# Patient Record
Sex: Male | Born: 1965 | Race: White | Hispanic: No | Marital: Married
Health system: Midwestern US, Community
[De-identification: ages and names within clinical notes are randomized; demographics above are authoritative.]

## PROBLEM LIST (undated history)

## (undated) DIAGNOSIS — K529 Noninfective gastroenteritis and colitis, unspecified: Secondary | ICD-10-CM

## (undated) DIAGNOSIS — C801 Malignant (primary) neoplasm, unspecified: Secondary | ICD-10-CM

## (undated) HISTORY — PX: COLON SURGERY: SHX602

## (undated) HISTORY — PX: FRACTURE SURGERY: SHX138

## (undated) HISTORY — DX: Noninfective gastroenteritis and colitis, unspecified: K52.9

---

## 2017-08-06 DIAGNOSIS — C189 Malignant neoplasm of colon, unspecified: Secondary | ICD-10-CM | POA: Insufficient documentation

## 2017-08-06 HISTORY — DX: Malignant neoplasm of colon, unspecified: C18.9

## 2017-12-02 NOTE — Nursing Note (Signed)
Medication Administration Follow Up-Text       Medication Administration Follow Up Entered On:  12/02/2017 21:57 EDT    Performed On:  12/02/2017 21:56 EDT by Nedra Hai, RN, Desiree      Intervention Information:     morphine  Performed by Nedra Hai, RN, Desiree on 12/02/2017 20:33:00 EDT       morphine,4mg   IM,Deltoid, Right       Med Response   ED Medication Response :   No adverse reaction, Symptoms improved   Numeric Rating Pain Scale :   7   Pasero Opioid Induced Sedation Scale :   1 = Awake and alert   Elaina Hoops - 12/02/2017 21:56 EDT

## 2017-12-02 NOTE — Nursing Note (Signed)
Medication Administration Follow Up-Text       Medication Administration Follow Up Entered On:  12/02/2017 21:57 EDT    Performed On:  12/02/2017 21:57 EDT by Nedra Hai, RN, Desiree      Intervention Information:     ketorolac  Performed by Nedra Hai RN, Desiree on 12/02/2017 20:33:00 EDT       ketorolac,60mg   IM,Thigh, Right       Med Response   ED Medication Response :   No adverse reaction, Symptoms improved   Numeric Rating Pain Scale :   7   Pasero Opioid Induced Sedation Scale :   1 = Awake and alert   Elaina Hoops - 12/02/2017 21:57 EDT

## 2017-12-12 DIAGNOSIS — C4491 Basal cell carcinoma of skin, unspecified: Secondary | ICD-10-CM

## 2017-12-12 HISTORY — DX: Basal cell carcinoma of skin, unspecified: C44.91

## 2018-03-10 DIAGNOSIS — N529 Male erectile dysfunction, unspecified: Secondary | ICD-10-CM | POA: Insufficient documentation

## 2018-04-10 ENCOUNTER — Inpatient Hospital Stay
Admit: 2018-04-10 | Discharge: 2018-04-10 | Disposition: A | Discharge status: Home / Self Care | Payer: TRICARE (CHAMPUS) | Attending: Emergency Medicine

## 2018-04-10 DIAGNOSIS — K529 Noninfective gastroenteritis and colitis, unspecified: Secondary | ICD-10-CM

## 2018-04-10 LAB — CBC WITH AUTO DIFFERENTIAL
Basophils %: 1 % (ref 0.0–2.0)
Basophils Absolute: 0 10*3/uL (ref 0.0–0.2)
Eosinophils %: 5 % (ref 0.5–7.8)
Eosinophils Absolute: 0.4 10*3/uL (ref 0.0–0.8)
Granulocyte Absolute Count: 0 10*3/uL (ref 0.0–0.5)
Hematocrit: 44.6 % (ref 41.1–50.3)
Hemoglobin: 14.7 g/dL (ref 13.6–17.2)
Immature Granulocytes: 0 % (ref 0.0–5.0)
Lymphocytes %: 18 % (ref 13–44)
Lymphocytes Absolute: 1.5 10*3/uL (ref 0.5–4.6)
MCH: 29.1 PG (ref 26.1–32.9)
MCHC: 33 g/dL (ref 31.4–35.0)
MCV: 88.3 FL (ref 79.6–97.8)
MPV: 9.6 FL (ref 9.4–12.3)
Monocytes %: 6 % (ref 4.0–12.0)
Monocytes Absolute: 0.5 10*3/uL (ref 0.1–1.3)
NRBC Absolute: 0 10*3/uL (ref 0.0–0.2)
Neutrophils %: 71 % (ref 43–78)
Neutrophils Absolute: 6 10*3/uL (ref 1.7–8.2)
Platelets: 237 10*3/uL (ref 150–450)
RBC: 5.05 M/uL (ref 4.23–5.6)
RDW: 12.6 % (ref 11.9–14.6)
WBC: 8.5 10*3/uL (ref 4.3–11.1)

## 2018-04-10 LAB — COMPREHENSIVE METABOLIC PANEL
ALT: 38 U/L (ref 12–65)
AST: 15 U/L (ref 15–37)
Albumin/Globulin Ratio: 1.1 — ABNORMAL LOW (ref 1.2–3.5)
Albumin: 4.2 g/dL (ref 3.5–5.0)
Alkaline Phosphatase: 91 U/L (ref 50–136)
Anion Gap: 6 mmol/L — ABNORMAL LOW (ref 7–16)
BUN: 14 MG/DL (ref 6–23)
CO2: 29 mmol/L (ref 21–32)
Calcium: 9 MG/DL (ref 8.3–10.4)
Chloride: 101 mmol/L (ref 98–107)
Creatinine: 0.9 MG/DL (ref 0.8–1.5)
EGFR IF NonAfrican American: >60 mL/min/{1.73_m2} (ref >60)
GFR African American: >60 mL/min/{1.73_m2} (ref >60)
Globulin: 3.7 g/dL — ABNORMAL HIGH (ref 2.3–3.5)
Glucose: 101 mg/dL — ABNORMAL HIGH (ref 65–100)
Potassium: 4.3 mmol/L (ref 3.5–5.1)
Sodium: 136 mmol/L (ref 136–145)
Total Bilirubin: 1 MG/DL (ref 0.2–1.1)
Total Protein: 7.9 g/dL (ref 6.3–8.2)

## 2018-04-10 LAB — EKG 12-LEAD
Atrial Rate: 300 {beats}/min
Q-T Interval: 426 ms
QRS Duration: 78 ms
QTc Calculation (Bazett): 426 ms
R Axis: 72 degrees
T Axis: 48 degrees
Ventricular Rate: 60 {beats}/min

## 2018-04-10 LAB — LIPASE
Lipase: 188 U/L (ref 73–393)
Lipase: 188 U/L (ref 73–393)

## 2018-04-10 LAB — CBC WITH AUTOMATED DIFF
ABS. BASOPHILS: 0 10*3/uL (ref 0.0–0.2)
ABS. EOSINOPHILS: 0.4 10*3/uL (ref 0.0–0.8)
ABS. IMM. GRANS.: 0 10*3/uL (ref 0.0–0.5)
ABS. LYMPHOCYTES: 1.5 10*3/uL (ref 0.5–4.6)
ABS. MONOCYTES: 0.5 10*3/uL (ref 0.1–1.3)
ABS. NEUTROPHILS: 6 10*3/uL (ref 1.7–8.2)
ABSOLUTE NRBC: 0 10*3/uL (ref 0.0–0.2)
BASOPHILS: 1 % (ref 0.0–2.0)
EOSINOPHILS: 5 % (ref 0.5–7.8)
HCT: 44.6 % (ref 41.1–50.3)
HGB: 14.7 g/dL (ref 13.6–17.2)
IMMATURE GRANULOCYTES: 0 % (ref 0.0–5.0)
LYMPHOCYTES: 18 % (ref 13–44)
MCH: 29.1 PG (ref 26.1–32.9)
MCHC: 33 g/dL (ref 31.4–35.0)
MCV: 88.3 FL (ref 79.6–97.8)
MONOCYTES: 6 % (ref 4.0–12.0)
MPV: 9.6 FL (ref 9.4–12.3)
NEUTROPHILS: 71 % (ref 43–78)
PLATELET: 237 10*3/uL (ref 150–450)
RBC: 5.05 M/uL (ref 4.23–5.6)
RDW: 12.6 % (ref 11.9–14.6)
WBC: 8.5 10*3/uL (ref 4.3–11.1)

## 2018-04-10 LAB — METABOLIC PANEL, COMPREHENSIVE
A-G Ratio: 1.1 — ABNORMAL LOW (ref 1.2–3.5)
ALT (SGPT): 38 U/L (ref 12–65)
AST (SGOT): 15 U/L (ref 15–37)
Albumin: 4.2 g/dL (ref 3.5–5.0)
Alk. phosphatase: 91 U/L (ref 50–136)
Anion gap: 6 mmol/L — ABNORMAL LOW (ref 7–16)
BUN: 14 MG/DL (ref 6–23)
Bilirubin, total: 1 MG/DL (ref 0.2–1.1)
CO2: 29 mmol/L (ref 21–32)
Calcium: 9 MG/DL (ref 8.3–10.4)
Chloride: 101 mmol/L (ref 98–107)
Creatinine: 0.9 MG/DL (ref 0.8–1.5)
GFR est AA: >60 mL/min/{1.73_m2} (ref >60)
GFR est non-AA: >60 mL/min/{1.73_m2} (ref >60)
Globulin: 3.7 g/dL — ABNORMAL HIGH (ref 2.3–3.5)
Glucose: 101 mg/dL — ABNORMAL HIGH (ref 65–100)
Potassium: 4.3 mmol/L (ref 3.5–5.1)
Protein, total: 7.9 g/dL (ref 6.3–8.2)
Sodium: 136 mmol/L (ref 136–145)

## 2018-04-10 LAB — EKG, 12 LEAD, INITIAL
Atrial Rate: 300 {beats}/min
Calculated R Axis: 72 degrees
Calculated T Axis: 48 degrees
Q-T Interval: 426 ms
QRS Duration: 78 ms
QTC Calculation (Bezet): 426 ms
Ventricular Rate: 60 {beats}/min

## 2018-04-10 MED ORDER — SODIUM CHLORIDE 0.9% BOLUS IV
0.9 % | Freq: Once | INTRAVENOUS | Status: AC
Start: 2018-04-10 — End: 2018-04-10
  Administered 2018-04-10: 15:00:00 via INTRAVENOUS

## 2018-04-10 MED ORDER — ALUM-MAG HYDROXIDE-SIMETH 200 MG-200 MG-20 MG/5 ML ORAL SUSP
200-200-20 mg/5 mL | ORAL | Status: AC
Start: 2018-04-10 — End: 2018-04-10
  Administered 2018-04-10: 14:00:00 via ORAL

## 2018-04-10 MED ORDER — PANTOPRAZOLE 40 MG TAB, DELAYED RELEASE
40 mg | ORAL_TABLET | Freq: Every day | ORAL | 0 refills | Status: AC
Start: 2018-04-10 — End: 2018-04-30

## 2018-04-10 MED ORDER — ONDANSETRON (PF) 4 MG/2 ML INJECTION
4 mg/2 mL | INTRAMUSCULAR | Status: AC
Start: 2018-04-10 — End: 2018-04-10
  Administered 2018-04-10: 15:00:00 via INTRAVENOUS

## 2018-04-10 MED ORDER — LIDOCAINE 2 % MUCOSAL SOLN
2 % | Status: AC
Start: 2018-04-10 — End: 2018-04-10
  Administered 2018-04-10: 14:00:00 via OROMUCOSAL

## 2018-04-10 MED ORDER — ONDANSETRON 4 MG TAB, RAPID DISSOLVE
4 mg | ORAL_TABLET | Freq: Three times a day (TID) | ORAL | 0 refills | Status: AC | PRN
Start: 2018-04-10 — End: ?

## 2018-04-10 MED FILL — MAG-AL PLUS 200 MG-200 MG-20 MG/5 ML ORAL SUSPENSION: 200-200-20 mg/5 mL | ORAL | Qty: 30

## 2018-04-10 MED FILL — LIDOCAINE 2 % MUCOSAL SOLN: 2 % | Qty: 15

## 2018-04-10 MED FILL — ONDANSETRON (PF) 4 MG/2 ML INJECTION: 4 mg/2 mL | INTRAMUSCULAR | Qty: 2

## 2018-04-10 NOTE — ED Notes (Signed)
Patient states he started having diffuse abdominal cramping since last night and states he passed a small blood clot when he felt like he had to have a bowel movement this morning. Patient states he has had some constipation for the past 1.5 months as well. Patient denies any vomiting but states he did have some nausea last night. Patient complains of some urinary retention last night that has since resolved.

## 2018-04-10 NOTE — ED Provider Notes (Signed)
Patient presents with epigastric and diffuse abdominal cramping starting last night at 2300.  Had a normal bowel movement at 2 AM but continues to have pain along with nausea but no vomiting so came in.    The history is provided by the patient. No language interpreter was used.   Abdominal Pain    This is a new problem. The current episode started yesterday. The problem occurs constantly. The problem has been gradually worsening. The pain is associated with an unknown factor. The pain is located in the generalized abdominal region. The quality of the pain is cramping. The pain is moderate. Associated symptoms include nausea. Pertinent negatives include no fever, no diarrhea, no melena, no vomiting, no constipation, no dysuria, no hematuria, no headaches, no chest pain and no back pain. Nothing worsens the pain. The pain is relieved by nothing.        Past Medical History:   Diagnosis Date   ??? Hypercholesteremia        Past Surgical History:   Procedure Laterality Date   ??? HX ORTHOPAEDIC     ??? HX UROLOGICAL           No family history on file.    Social History     Socioeconomic History   ??? Marital status: Not on file     Spouse name: Not on file   ??? Number of children: Not on file   ??? Years of education: Not on file   ??? Highest education level: Not on file   Occupational History   ??? Not on file   Social Needs   ??? Financial resource strain: Not on file   ??? Food insecurity:     Worry: Not on file     Inability: Not on file   ??? Transportation needs:     Medical: Not on file     Non-medical: Not on file   Tobacco Use   ??? Smoking status: Never Smoker   ??? Smokeless tobacco: Never Used   Substance and Sexual Activity   ??? Alcohol use: Yes     Comment: socially   ??? Drug use: Not Currently   ??? Sexual activity: Not on file   Lifestyle   ??? Physical activity:     Days per week: Not on file     Minutes per session: Not on file   ??? Stress: Not on file   Relationships   ??? Social connections:     Talks on phone: Not on file      Gets together: Not on file     Attends religious service: Not on file     Active member of club or organization: Not on file     Attends meetings of clubs or organizations: Not on file     Relationship status: Not on file   ??? Intimate partner violence:     Fear of current or ex partner: Not on file     Emotionally abused: Not on file     Physically abused: Not on file     Forced sexual activity: Not on file   Other Topics Concern   ??? Not on file   Social History Narrative   ??? Not on file         ALLERGIES: Patient has no known allergies.    Review of Systems   Constitutional: Negative for chills and fever.   HENT: Negative for rhinorrhea and sore throat.    Eyes: Negative for pain and redness.  Respiratory: Negative for chest tightness, shortness of breath and wheezing.    Cardiovascular: Negative for chest pain and leg swelling.   Gastrointestinal: Positive for abdominal pain and nausea. Negative for constipation, diarrhea, melena and vomiting.   Genitourinary: Negative for dysuria and hematuria.   Musculoskeletal: Negative for back pain, gait problem, neck pain and neck stiffness.   Skin: Negative for color change and rash.   Neurological: Negative for weakness, numbness and headaches.       Vitals:    04/10/18 1014   BP: (!) 128/92   Pulse: 68   Resp: 16   Temp: 98.7 ??F (37.1 ??C)   SpO2: 97%   Weight: 86.2 kg (190 lb)   Height: 6\' 2"  (1.88 m)            Physical Exam   Constitutional: He is oriented to person, place, and time. He appears well-developed and well-nourished.   HENT:   Head: Normocephalic and atraumatic.   Neck: Normal range of motion. Neck supple.   Cardiovascular: Normal rate and regular rhythm.   Pulmonary/Chest: Effort normal and breath sounds normal.   Abdominal: Soft. Bowel sounds are normal. There is tenderness (epigastric).   Musculoskeletal: Normal range of motion. He exhibits no edema.   Neurological: He is alert and oriented to person, place, and time.   Skin: Skin is warm and dry.         MDM  Number of Diagnoses or Management Options  Diagnosis management comments: And feeling better here after medication.  Will treat as gastroenteritis and discharged with follow-up.       Amount and/or Complexity of Data Reviewed  Clinical lab tests: ordered and reviewed  Tests in the medicine section of CPT??: ordered and reviewed    Patient Progress  Patient progress: stable         Procedures        EKG: normal sinus rhythm, nonspecific ST and T waves changes. RAte 60.        Results Include:    Recent Results (from the past 24 hour(s))   CBC WITH AUTOMATED DIFF    Collection Time: 04/10/18 10:16 AM   Result Value Ref Range    WBC 8.5 4.3 - 11.1 K/uL    RBC 5.05 4.23 - 5.6 M/uL    HGB 14.7 13.6 - 17.2 g/dL    HCT 14.7 82.9 - 56.2 %    MCV 88.3 79.6 - 97.8 FL    MCH 29.1 26.1 - 32.9 PG    MCHC 33.0 31.4 - 35.0 g/dL    RDW 13.0 86.5 - 78.4 %    PLATELET 237 150 - 450 K/uL    MPV 9.6 9.4 - 12.3 FL    ABSOLUTE NRBC 0.00 0.0 - 0.2 K/uL    DF AUTOMATED      NEUTROPHILS 71 43 - 78 %    LYMPHOCYTES 18 13 - 44 %    MONOCYTES 6 4.0 - 12.0 %    EOSINOPHILS 5 0.5 - 7.8 %    BASOPHILS 1 0.0 - 2.0 %    IMMATURE GRANULOCYTES 0 0.0 - 5.0 %    ABS. NEUTROPHILS 6.0 1.7 - 8.2 K/UL    ABS. LYMPHOCYTES 1.5 0.5 - 4.6 K/UL    ABS. MONOCYTES 0.5 0.1 - 1.3 K/UL    ABS. EOSINOPHILS 0.4 0.0 - 0.8 K/UL    ABS. BASOPHILS 0.0 0.0 - 0.2 K/UL    ABS. IMM. GRANS. 0.0 0.0 - 0.5 K/UL  METABOLIC PANEL, COMPREHENSIVE    Collection Time: 04/10/18 10:16 AM   Result Value Ref Range    Sodium 136 136 - 145 mmol/L    Potassium 4.3 3.5 - 5.1 mmol/L    Chloride 101 98 - 107 mmol/L    CO2 29 21 - 32 mmol/L    Anion gap 6 (L) 7 - 16 mmol/L    Glucose 101 (H) 65 - 100 mg/dL    BUN 14 6 - 23 MG/DL    Creatinine 9.52 0.8 - 1.5 MG/DL    GFR est AA >84 >13 KG/MWN/0.27O5    GFR est non-AA >60 >60 ml/min/1.42m2    Calcium 9.0 8.3 - 10.4 MG/DL    Bilirubin, total 1.0 0.2 - 1.1 MG/DL    ALT (SGPT) 38 12 - 65 U/L    AST (SGOT) 15 15 - 37 U/L    Alk. phosphatase  91 50 - 136 U/L    Protein, total 7.9 6.3 - 8.2 g/dL    Albumin 4.2 3.5 - 5.0 g/dL    Globulin 3.7 (H) 2.3 - 3.5 g/dL    A-G Ratio 1.1 (L) 1.2 - 3.5     LIPASE    Collection Time: 04/10/18 10:16 AM   Result Value Ref Range    Lipase 188 73 - 393 U/L   EKG, 12 LEAD, INITIAL    Collection Time: 04/10/18 11:00 AM   Result Value Ref Range    Ventricular Rate 60 BPM    Atrial Rate 300 BPM    QRS Duration 78 ms    Q-T Interval 426 ms    QTC Calculation (Bezet) 426 ms    Calculated R Axis 72 degrees    Calculated T Axis 48 degrees    Diagnosis       !! AGE AND GENDER SPECIFIC ECG ANALYSIS !!  Normal sinus rhythm  Within normal limits  No previous ECGs available  Confirmed by CEBE  MD (UC), Balinda Quails [phone]) on 04/10/2018 11:16:50 AM

## 2018-04-10 NOTE — ED Notes (Signed)
I have reviewed discharge instructions with the patient.  The patient verbalized understanding.    Patient left ED via Discharge Method: ambulatory to Home with his wife.    Opportunity for questions and clarification provided.       Patient given 3 scripts.         To continue your aftercare when you leave the hospital, you may receive an automated call from our care team to check in on how you are doing.  This is a free service and part of our promise to provide the best care and service to meet your aftercare needs." If you have questions, or wish to unsubscribe from this service please call [phone].  Thank you for Choosing our [facility] Emergency Department.

## 2018-04-10 NOTE — ED Triage Notes (Signed)
Patient states he started having diffuse abdominal cramping since last night and states he passed a small blood clot when he felt like he had to have a bowel movement this morning. Patient states he has had some constipation for the past 1.5 months as well. Patient denies any vomiting but states he did have some nausea last night. Patient complains of some urinary retention last night that has since resolved.

## 2018-04-10 NOTE — ED Notes (Signed)
I have reviewed discharge instructions with the patient.  The patient verbalized understanding.    Patient left ED via Discharge Method: ambulatory to Home with his wife.    Opportunity for questions and clarification provided.       Patient given 3 scripts.         To continue your aftercare when you leave the hospital, you may receive an automated call from our care team to check in on how you are doing.  This is a free service and part of our promise to provide the best care and service to meet your aftercare needs.??? If you have questions, or wish to unsubscribe from this service please call [phone].  Thank you for Choosing our [place] [facility] Department.

## 2018-04-10 NOTE — ED Provider Notes (Signed)
Patient presents with epigastric and diffuse abdominal cramping starting last night at 2300.  Had a normal bowel movement at 2 AM but continues to have pain along with nausea but no vomiting so came in.    The history is provided by the patient. No language interpreter was used.   Abdominal Pain    This is a new problem. The current episode started yesterday. The problem occurs constantly. The problem has been gradually worsening. The pain is associated with an unknown factor. The pain is located in the generalized abdominal region. The quality of the pain is cramping. The pain is moderate. Associated symptoms include nausea. Pertinent negatives include no fever, no diarrhea, no melena, no vomiting, no constipation, no dysuria, no hematuria, no headaches, no chest pain and no back pain. Nothing worsens the pain. The pain is relieved by nothing.        Past Medical History:   Diagnosis Date   ??? Hypercholesteremia        Past Surgical History:   Procedure Laterality Date   ??? HX ORTHOPAEDIC     ??? HX UROLOGICAL           No family history on file.    Social History     Socioeconomic History   ??? Marital status: Not on file     Spouse name: Not on file   ??? Number of children: Not on file   ??? Years of education: Not on file   ??? Highest education level: Not on file   Occupational History   ??? Not on file   Social Needs   ??? Financial resource strain: Not on file   ??? Food insecurity:     Worry: Not on file     Inability: Not on file   ??? Transportation needs:     Medical: Not on file     Non-medical: Not on file   Tobacco Use   ??? Smoking status: Never Smoker   ??? Smokeless tobacco: Never Used   Substance and Sexual Activity   ??? Alcohol use: Yes     Comment: socially   ??? Drug use: Not Currently   ??? Sexual activity: Not on file   Lifestyle   ??? Physical activity:     Days per week: Not on file     Minutes per session: Not on file   ??? Stress: Not on file   Relationships   ??? Social connections:     Talks on phone: Not on file      Gets together: Not on file     Attends religious service: Not on file     Active member of club or organization: Not on file     Attends meetings of clubs or organizations: Not on file     Relationship status: Not on file   ??? Intimate partner violence:     Fear of current or ex partner: Not on file     Emotionally abused: Not on file     Physically abused: Not on file     Forced sexual activity: Not on file   Other Topics Concern   ??? Not on file   Social History Narrative   ??? Not on file         ALLERGIES: Patient has no known allergies.    Review of Systems   Constitutional: Negative for chills and fever.   HENT: Negative for rhinorrhea and sore throat.    Eyes: Negative for pain and redness.  Respiratory: Negative for chest tightness, shortness of breath and wheezing.    Cardiovascular: Negative for chest pain and leg swelling.   Gastrointestinal: Positive for abdominal pain and nausea. Negative for constipation, diarrhea, melena and vomiting.   Genitourinary: Negative for dysuria and hematuria.   Musculoskeletal: Negative for back pain, gait problem, neck pain and neck stiffness.   Skin: Negative for color change and rash.   Neurological: Negative for weakness, numbness and headaches.       Vitals:    04/10/18 1014   BP: (!) 128/92   Pulse: 68   Resp: 16   Temp: 98.7 ??F (37.1 ??C)   SpO2: 97%   Weight: 86.2 kg (190 lb)   Height: '6\' 2"'  (1.88 m)            Physical Exam   Constitutional: He is oriented to person, place, and time. He appears well-developed and well-nourished.   HENT:   Head: Normocephalic and atraumatic.   Neck: Normal range of motion. Neck supple.   Cardiovascular: Normal rate and regular rhythm.   Pulmonary/Chest: Effort normal and breath sounds normal.   Abdominal: Soft. Bowel sounds are normal. There is tenderness (epigastric).   Musculoskeletal: Normal range of motion. He exhibits no edema.   Neurological: He is alert and oriented to person, place, and time.   Skin: Skin is warm and dry.         MDM  Number of Diagnoses or Management Options  Diagnosis management comments: And feeling better here after medication.  Will treat as gastroenteritis and discharged with follow-up.       Amount and/or Complexity of Data Reviewed  Clinical lab tests: ordered and reviewed  Tests in the medicine section of CPT??: ordered and reviewed    Patient Progress  Patient progress: stable         Procedures        EKG: normal sinus rhythm, nonspecific ST and T waves changes. RAte 60.        Results Include:    Recent Results (from the past 24 hour(s))   CBC WITH AUTOMATED DIFF    Collection Time: 04/10/18 10:16 AM   Result Value Ref Range    WBC 8.5 4.3 - 11.1 K/uL    RBC 5.05 4.23 - 5.6 M/uL    HGB 14.7 13.6 - 17.2 g/dL    HCT 44.6 41.1 - 50.3 %    MCV 88.3 79.6 - 97.8 FL    MCH 29.1 26.1 - 32.9 PG    MCHC 33.0 31.4 - 35.0 g/dL    RDW 12.6 11.9 - 14.6 %    PLATELET 237 150 - 450 K/uL    MPV 9.6 9.4 - 12.3 FL    ABSOLUTE NRBC 0.00 0.0 - 0.2 K/uL    DF AUTOMATED      NEUTROPHILS 71 43 - 78 %    LYMPHOCYTES 18 13 - 44 %    MONOCYTES 6 4.0 - 12.0 %    EOSINOPHILS 5 0.5 - 7.8 %    BASOPHILS 1 0.0 - 2.0 %    IMMATURE GRANULOCYTES 0 0.0 - 5.0 %    ABS. NEUTROPHILS 6.0 1.7 - 8.2 K/UL    ABS. LYMPHOCYTES 1.5 0.5 - 4.6 K/UL    ABS. MONOCYTES 0.5 0.1 - 1.3 K/UL    ABS. EOSINOPHILS 0.4 0.0 - 0.8 K/UL    ABS. BASOPHILS 0.0 0.0 - 0.2 K/UL    ABS. IMM. GRANS. 0.0 0.0 - 0.5 K/UL  METABOLIC PANEL, COMPREHENSIVE    Collection Time: 04/10/18 10:16 AM   Result Value Ref Range    Sodium 136 136 - 145 mmol/L    Potassium 4.3 3.5 - 5.1 mmol/L    Chloride 101 98 - 107 mmol/L    CO2 29 21 - 32 mmol/L    Anion gap 6 (L) 7 - 16 mmol/L    Glucose 101 (H) 65 - 100 mg/dL    BUN 14 6 - 23 MG/DL    Creatinine 0.90 0.8 - 1.5 MG/DL    GFR est AA >60 >60 ml/min/1.53m    GFR est non-AA >60 >60 ml/min/1.737m   Calcium 9.0 8.3 - 10.4 MG/DL    Bilirubin, total 1.0 0.2 - 1.1 MG/DL    ALT (SGPT) 38 12 - 65 U/L    AST (SGOT) 15 15 - 37 U/L     Alk. phosphatase 91 50 - 136 U/L    Protein, total 7.9 6.3 - 8.2 g/dL    Albumin 4.2 3.5 - 5.0 g/dL    Globulin 3.7 (H) 2.3 - 3.5 g/dL    A-G Ratio 1.1 (L) 1.2 - 3.5     LIPASE    Collection Time: 04/10/18 10:16 AM   Result Value Ref Range    Lipase 188 73 - 393 U/L   EKG, 12 LEAD, INITIAL    Collection Time: 04/10/18 11:00 AM   Result Value Ref Range    Ventricular Rate 60 BPM    Atrial Rate 300 BPM    QRS Duration 78 ms    Q-T Interval 426 ms    QTC Calculation (Bezet) 426 ms    Calculated R Axis 72 degrees    Calculated T Axis 48 degrees    Diagnosis       !! AGE AND GENDER SPECIFIC ECG ANALYSIS !!  Normal sinus rhythm  Within normal limits  No previous ECGs available  Confirmed by CEBE  MD (UC), JOBalinda [phone] 04/10/2018 11:16:50 AM

## 2018-05-30 NOTE — Nursing Note (Signed)
Ambulatory Vitals, Ht, Wt - Text       Ambulatory Vitals Height Weight Entered On:  05/30/2018 10:14 EDT    Performed On:  05/30/2018 10:13 EDT by Morene Rankins,  ROBIN M               Vitals/Ht/Wt   Weight Dosing :   87.09 kg(Converted to: 192 lb 0 oz, 192.001 lb)    Weight Measured :   87.09 kg(Converted to: 192 lb 0 oz, 192.001 lb)    Height/Length Measured :   188 cm(Converted to: 6 ft 2 in, 6.17 ft, 74.02 in)    BSA Measured :   2.14 m2   Body Mass Index Measured :   24.64 kg/m2   Lavone Neri - 05/30/2018 10:13 EDT

## 2018-06-06 DIAGNOSIS — Z860100 Personal history of colon polyps, unspecified: Secondary | ICD-10-CM

## 2018-06-06 HISTORY — PX: COLONOSCOPY: SHX174

## 2018-06-06 HISTORY — DX: Personal history of colon polyps, unspecified: Z86.0100

## 2018-06-06 HISTORY — DX: Personal history of colonic polyps: Z86.010

## 2018-06-11 DIAGNOSIS — C801 Malignant (primary) neoplasm, unspecified: Secondary | ICD-10-CM | POA: Insufficient documentation

## 2018-07-07 NOTE — Nursing Note (Signed)
Adult Admission Assessment - Text       Perioperative Admission Assessment Entered On:  07/07/2018 15:31 EST    Performed On:  07/07/2018 15:19 EST by Vista Deck, RN, [place]   Call Start :   07/07/2018 15:19 EST   Call Complete :   07/07/2018 15:31 EST   Information Given By :   Self   Height/Length Estimated :   188 cm(Converted to: 6 ft 2 in, 6.17 ft, 74.02 in)    Weight   Estimated :   86.1 kg(Converted to: 189 lb 13 oz, 189.818 lb)    Primary Care Physician/Specialists :   DR. [place]   Emergency Contact Name :   [place]   Emergency Contact Phone :   [phone]   Languages :   Cleophus Molt   Preferred Communication Mode :   Verbal   Vista Deck, RN, CARLA W - 07/07/2018 15:19 EST   Allergies   (As Of: 07/10/2018 11:47:13 EST)   Allergies (Active)   No Known Medication Allergies  Estimated Onset Date:   Unspecified ; Created ByTruman Hayward, RN, Desiree; Reaction Status:   Active ; Category:   Drug ; Substance:   No Known Medication Allergies ; Type:   Allergy ; Updated By:   Truman Hayward RN, [place]; Reviewed Date:   07/10/2018 11:43 EST        Medication History   Medication List   (As Of: 07/10/2018 11:47:14 EST)   Normal Order    Lactated Ringers Injection solution 1000 mL  :   Lactated Ringers Injection solution 1000 mL ; Status:   Ordered ; Ordered As Mnemonic:   Lactated Ringers Injection 1000 mL ; Simple Display Line:   40 mL/hr, IV ; Ordering Provider:   Kathryne Hitch; Catalog Code:   Lactated Ringers Injection ; Order Dt/Tm:   07/09/2018 23:08:35 EST ; Comment:   Perioperative use ONLY  For Non Dialysis Patient          sodium chloride 0.9% Inj Soln 10 mL syringe  :   sodium chloride 0.9% Inj Soln 10 mL syringe ; Status:   Ordered ; Ordered As Mnemonic:   sodium chloride 0.9% flush syringe range dose ; Simple Display Line:   30 mL, IV Push, q8hr ; Ordering Provider:   Elease Hashimoto; Catalog Code:   sodium chloride flush ; Order Dt/Tm:   07/10/2018 11:35:40 EST           A Patient Specific Medication  :   A Patient Specific Medication ; Status:   Ordered ; Ordered As Mnemonic:   A Patient Specific Medication ; Simple Display Line:   1 EA, Kit-Combo, q34mn, PRN: other (see comment) ; Ordering Provider:   DElease Hashimoto Catalog Code:   A Patient Specific Medication ; Order Dt/Tm:   07/10/2018 11:35:41 EST          A Patient Specific Refrigerated Medication  :   A Patient Specific Refrigerated Medication ; Status:   Ordered ; Ordered As Mnemonic:   A Patient Specific Refrigerated Medication ; Simple Display Line:   1 EA, Kit-Combo, q570m, PRN: other (see comment) ; Ordering Provider:   DEElease HashimotoCatalog Code:   A Patient Specific Refrigerated Medicati ; Order Dt/Tm:   07/10/2018 11:35:41 EST ; Comment:   to access the patient  specific Refrigerated medications          acetaminophen 500 mg Tab  :   acetaminophen 500 mg Tab ; Status:   Ordered ; Ordered As Mnemonic:   Tylenol ; Simple Display Line:   1,000 mg, 2 tabs, Oral, On Call ; Ordering Provider:   Kathryne Hitch; Catalog Code:   acetaminophen ; Order Dt/Tm:   07/09/2018 23:08:35 EST          ceFAZolin  :   ceFAZolin ; Status:   Ordered ; Ordered As Mnemonic:   ceFAZolin ; Simple Display Line:   2 g, IV Piggyback, On Call ; Ordering Provider:   Kathryne Hitch; Catalog Code:   ceFAZolin ; Order Dt/Tm:   07/09/2018 23:08:35 EST ; Comment:   Wt< 120 kg/264lb          celecoxib 400 mg Cap  :   celecoxib 400 mg Cap ; Status:   Ordered ; Ordered As Mnemonic:   CeleBREX ; Simple Display Line:   400 mg, 1 caps, Oral, On Call ; Ordering Provider:   Kathryne Hitch; Catalog Code:   celecoxib ; Order Dt/Tm:   07/09/2018 23:08:36 EST ; Comment:   Do not administer with Toradol          Delivery and Return Bin Access  :   Delivery and Return [place] Access ; Status:   Ordered ; Ordered As Mnemonic:   Delivery and Return Bin Access ; Simple Display Line:   1 EA, Kit-Combo, q45mn, PRN: other (see  comment) ; Ordering Provider:   DElease Hashimoto Catalog Code:   Delivery and Return Bin Access ; Order Dt/Tm:   07/10/2018 11:35:41 EST ; Comment:   This code grants access to the AConstellation Energyfor the Delivery and Return Bin Access          lidocaine 1% PF Inj Soln 2 mL  :   lidocaine 1% PF Inj Soln 2 mL ; Status:   Ordered ; Ordered As Mnemonic:   lidocaine 1% preservative-free injectable solution ; Simple Display Line:   0.25 mL, ID, q570m, PRN: other (see comment) ; Ordering Provider:   DEElease HashimotoCatalog Code:   lidocaine ; Order Dt/Tm:   07/10/2018 11:35:41 EST ; Comment:   to access lidocaine 1%  2 mL vial for IV start and Life Port access          lidocaine 2% Topical Gel with applicator 1016-10L  :   lidocaine 2% Topical Gel with applicator 1096-04L ; Status:   Ordered ; Ordered As Mnemonic:   Uro-Jet 2% topical gel with applicator ; Simple Display Line:   1 app, Topical, q5m103m PRN: other (see comment) ; Ordering Provider:   DEMElease Hashimotoatalog Code:   lidocaine topical ; Order Dt/Tm:   07/10/2018 11:35:41 EST          Respiratory MDI Treatment  :   Respiratory MDI Treatment ; Status:   Ordered ; Ordered As Mnemonic:   Respiratory MDI Treatment ; Simple Display Line:   1 EA, Kit-Combo, q5mi63mPRN: other (see comment) ; Ordering Provider:   DEMAElease Hashimototalog Code:   Respiratory MDI Treatment ; Order Dt/Tm:   07/10/2018 11:35:41 EST          sodium chloride 0.9% Inj Soln 10 mL syringe  :   sodium chloride 0.9% Inj Soln 10 mL syringe ; Status:  Ordered ; Ordered As Mnemonic:   sodium chloride 0.9% flush syringe range dose ; Simple Display Line:   30 mL, IV Push, q41mn, PRN: other (see comment) ; Ordering Provider:   DElease Hashimoto Catalog Code:   sodium chloride flush ; Order Dt/Tm:   07/10/2018 11:35:30 EST          sodium chloride 0.9% Inj Soln 10 mL vial PF  :   sodium chloride 0.9% Inj Soln 10 mL vial PF ; Status:   Ordered ; Ordered As Mnemonic:    sodium chloride 0.9% vial for reconstitution range dose ; Simple Display Line:   30 mL, IV Push, q543m, PRN: other (see comment) ; Ordering Provider:   DEElease HashimotoCatalog Code:   sodium chloride flush ; Order Dt/Tm:   07/10/2018 11:35:40 EST ; Comment:   for access to sodium chloride 0.9% vial when needed as a diluent for reconstitutable medications          sterile water Inj Soln 10 mL  :   sterile water Inj Soln 10 mL ; Status:   Ordered ; Ordered As Mnemonic:   sterile water for reconstitution ; Simple Display Line:   10 mL, N/A, q5m78m PRN: other (see comment) ; Ordering Provider:   DEMElease Hashimotoatalog Code:   sterile water for reconstitution ; Order Dt/Tm:   07/10/2018 11:35:41 EST ; Comment:   Access sterile water when needed as a diluent for reconstitutable medications. Not for IV use.            Home Meds    aspirin  :   aspirin ; Status:   Documented ; Ordered As Mnemonic:   aspirin ; Simple Display Line:   81 mg, Oral, qPM, 0 Refill(s) ; Catalog Code:   aspirin ; Order Dt/Tm:   12/02/2017 20:62:13:08T          cetirizine  :   cetirizine ; Status:   Documented ; Ordered As Mnemonic:   ZyrTEC 10 mg oral tablet ; Simple Display Line:   mg, tabs, Oral, qPM, 0 Refill(s) ; Catalog Code:   cetirizine ; Order Dt/Tm:   12/02/2017 20:65:78:46T          rosuvastatin  :   rosuvastatin ; Status:   Documented ; Ordered As Mnemonic:   Crestor 40 mg oral tablet ; Simple Display Line:   40 mg, 1 tabs, Oral, qPM, 0 Refill(s) ; Ordering Provider:   EVADario Aveatalog Code:   rosuvastatin ; Order Dt/Tm:   12/02/2017 20:22:59 EDT            Problem History   (As Of: 07/10/2018 11:47:14 EST)   Problems(Active)    Hx of melanoma of skin (SNOMED CT  :2969629528413 Name of Problem:   Hx of melanoma of skin ; Recorder:   LUTZ, RN, CARLA W; Confirmation:   Confirmed ; Classification:   Patient Stated ; Code:   [id number] System:   PowerChart ; Last Updated:   07/07/2018 15:25 EST ; Life  Cycle Date:   07/07/2018 ; Life Cycle Status:   Active ; Vocabulary:   SNOMED CT        Hyperlipidemia (SNOMED CT  :92836644034 Name of Problem:   Hyperlipidemia ; Recorder:   LeeTruman Hayward, Desiree; Confirmation:   Confirmed ; Classification:   Patient Stated ; Code:   [id number] System:   PowerChart ; Last Updated:  12/02/2017 20:24 EDT ; Life Cycle Date:   12/02/2017 ; Life Cycle Status:   Active ; Vocabulary:   SNOMED CT        OSA on CPAP (SNOMED CT  :416606301 )  Name of Problem:   OSA on CPAP ; Recorder:   LUTZ, RN, CARLA W; Confirmation:   Confirmed ; Classification:   Patient Stated ; Code:   [id number] ; Contributor System:   PowerChart ; Last Updated:   07/07/2018 15:24 EST ; Life Cycle Date:   07/07/2018 ; Life Cycle Status:   Active ; Vocabulary:   SNOMED CT        Seasonal allergies (SNOMED CT  :5732202542 )  Name of Problem:   Seasonal allergies ; Recorder:   LUTZ, RN, CARLA W; Confirmation:   Confirmed ; Classification:   Patient Stated ; Code:   [id number] ; Contributor System:   Conservation officer, nature ; Last Updated:   07/07/2018 15:22 EST ; Life Cycle Date:   07/07/2018 ; Life Cycle Status:   Active ; Vocabulary:   SNOMED CT        Shoulder pain, right (SNOMED CT  :15176160 )  Name of Problem:   Shoulder pain, right ; Recorder:   LUTZ, RN, CARLA W; Confirmation:   Confirmed ; Classification:   Patient Stated ; Code:   [id number] ; Contributor System:   Conservation officer, nature ; Last Updated:   07/07/2018 15:23 EST ; Life Cycle Date:   07/07/2018 ; Life Cycle Status:   Active ; Vocabulary:   SNOMED CT          Diagnoses(Active)    Adhesive capsulitis of right shoulder  Date:   05/30/2018 ; Diagnosis Type:   Discharge ; Confirmation:   Confirmed ; Clinical Dx:   Adhesive capsulitis of right shoulder ; Classification:   Medical ; Clinical Service:   Non-Specified ; Code:   ICD-10-CM ; Probability:   0 ; Diagnosis Code:   M75.01      Osteoarthritis of right shoulder  Date:   05/30/2018 ; Diagnosis Type:   Discharge ; Confirmation:    Confirmed ; Clinical Dx:   Osteoarthritis of right shoulder ; Classification:   Medical ; Clinical Service:   Non-Specified ; Code:   ICD-10-CM ; Probability:   0 ; Diagnosis Code:   M19.011        Procedure History        -    Procedure History   (As Of: 07/10/2018 11:47:14 EST)     Anesthesia Minutes:   0 ; Procedure Name:   Vasectomy ; Procedure Minutes:   0 ; Last Reviewed Dt/Tm:   07/10/2018 11:45:07 EST            Anesthesia Minutes:   0 ; Procedure Name:   excision of melanoma ; Procedure Minutes:   0 ; Last Reviewed Dt/Tm:   07/10/2018 11:45:07 EST            Anesthesia Minutes:   0 ; Procedure Name:   throat polypectomy ; Procedure Minutes:   0 ; Last Reviewed Dt/Tm:   07/10/2018 11:45:07 EST            Anesthesia Minutes:   0 ; Procedure Name:   left small finger bone spur removal ; Procedure Minutes:   0 ; Last Reviewed Dt/Tm:   07/10/2018 11:45:07 EST            History Confirmation   Problem History Changes PAT :   No  Procedure History Changes PAT :   No   JEFFCOAT, RN, CONSTANCE - 07/10/2018 11:42 EST   Anesthesia/Sedation   Anesthesia History :   Prior general anesthesia   SN - Malignant Hyperthermia :   Denies   Previous Problem with Anesthesia :   Other: WOKE UP DURING SURGERY MULTIPLE TIMES.   Opioid Exposure :   Opioid Naive   Moderate Sedation History :   Prior sedation for procedure   Previous Problem With Sedation :   None   Symptoms of Sleep Apnea :   Age greater than 18, Male Gender   Pregnancy Status :   N/A   LUTZ, RN, CARLA W - 07/07/2018 15:19 EST   Bloodless Medicine   Will Patient Accept Blood Transfusion and/or Blood Products :   Yes   LUTZ, RN, CARLA W - 07/07/2018 15:19 EST   ID Risk Screen Symptoms   Recent Travel History :   No recent travel   TB Symptom Screen :   No symptoms   C. diff Symptom/History ID :   Neither of the above   LUTZ, RN, Edythe Lynn - 07/07/2018 15:19 EST   Social History   Social History   (As Of: 07/10/2018 11:47:14 EST)   Tobacco:        Tobacco use: Former smoker,  quit more than 30 days ago.  Cigarettes   (Last Updated: 07/07/2018 15:28:42 EST by LUTZ, RN, CARLA W)          Alcohol:        Current, Wine, 3-5 times per week   (Last Updated: 07/07/2018 15:29:03 EST by Vista Deck, RN, Edythe Lynn)          Substance Abuse:        Denies   (Last Updated: 07/07/2018 15:29:10 EST by Vista Deck, RN, Edythe Lynn)            Advance Directive   Advance Directive :   No   LUTZ, RN, Angela Nevin W - 07/07/2018 15:19 EST   PAT Patient Instructions   Patient Arrival Time PAT :   07/10/2018 0:00 EST   Medications in AM :   NONE   Medication Understanding :   Verbalizes understanding   NPO PAT :   NPO after midnight   LUTZ, RN, CARLA W - 07/07/2018 15:19 EST   PAT Instructions Grid   Jewelry Understanding :   Verbalizes understanding   Perfume Understanding :   Verbalizes understanding   Valuables Understanding :   Verbalizes understanding   Clothing Understanding :   Verbalizes understanding   Contact MD for Illness :   Verbalizes understanding   Contact MD for skin injury :   Verbalizes understanding   LUTZ, RN, Edythe Lynn - 07/07/2018 15:19 EST   Service Line PAT :   Ortho   Laterality PAT :   Right   Prep PAT :   Hibiclens, CPAP   Name of Contact PAT :   SABRINA Wenk   Relationship of Contact PAT :   WIFE   Contact Number PAT :   [phone]   Transportation Instructions PAT :   Accompany to [place] with 24 hours post-procedure   LUTZ, RN, CARLA W - 07/07/2018 15:19 EST   Harm Screen   Injuries/Abuse/Neglect in Household :   Denies   Feels Unsafe at Home :   No   Suicidal Ideation :   None   JEFFCOAT, RN, CONSTANCE - 07/10/2018 11:42 EST

## 2018-07-10 NOTE — Discharge Summary (Signed)
Inpatient Clinical Summary             [facility]  Post-Acute Care Transfer Instructions  PERSON INFORMATION   Name: Dale Pope, Dale Pope   MRN: [id number]    FIN#: [id number]   PHYSICIANS  Admitting Physician: Edwin CapEMARCO-MD,  JAMES R  Attending Physician: Edwin CapEMARCO-MD,  JAMES R   PCP: Elaina HoopsEVANS-MD,  SCOTT CHADWICK  Discharge Diagnosis: Adhesive capsulitis of right shoulder; Osteoarthritis of right shoulder  Comment:       PATIENT EDUCATION INFORMATION  Instructions:             On Q Pump ([id number]) DEMARCO (Custom); Anesthesia: After Your Surgery (JEFFCO) (JEFFCO); Shoulder Scope Standard Post Op Inst w Tylenol JRD [phone]([id number])  Medication Leaflets:               Follow-up:                           With: Address: When:   SCOTT EVANS-MD [address], [address] [place], [place] [id number]  [phone] Business (1)                              MEDICATION LIST  Medication Reconciliation at Discharge:          New Medications  Printed Prescriptions  ondansetron (Zofran 4 mg oral tablet) 1-2 tabs Oral (given by mouth) every 6 hours as needed as needed for nausea/vomiting for 7 Days. Refills: 0.  Last Dose:____________________  oxyCODONE (oxyCODONE 5 mg oral tablet) 1-2 tabs Oral (given by mouth) every 6 hours as needed for pain for 5 Days. Refills: 0.  Last Dose:____________________  Medications That Have Not Changed  Other Medications  aspirin 81 Milligram Oral (given by mouth) once a day (in the evening).  Last Dose:____________________  cetirizine (ZyrTEC 10 mg oral tablet) Oral (given by mouth) once a day (in the evening).  Last Dose:____________________  rosuvastatin (Crestor 40 mg oral tablet) 1 Tabs Oral (given by mouth) once a day (in the evening).  Last Dose:____________________         Patient's Final Home Medication List Upon Discharge:           aspirin 81 Milligram Oral (given by mouth) once a day (in the evening).  cetirizine (ZyrTEC 10 mg oral tablet) Oral (given by mouth) once a day (in the evening).  ondansetron  (Zofran 4 mg oral tablet) 1-2 tabs Oral (given by mouth) every 6 hours as needed as needed for nausea/vomiting for 7 Days. Refills: 0.  oxyCODONE (oxyCODONE 5 mg oral tablet) 1-2 tabs Oral (given by mouth) every 6 hours as needed for pain for 5 Days. Refills: 0.  rosuvastatin (Crestor 40 mg oral tablet) 1 Tabs Oral (given by mouth) once a day (in the evening).         Comment:       ORDERS          Order Name Order Details   Discharge Patient 07/10/18 14:58:00 EST

## 2018-07-10 NOTE — Discharge Summary (Signed)
Inpatient Patient Summary               [facility]  [address]  [place], [place] [address]  [phone]  Patient Discharge Instructions    Name: Dale Pope, Dale Pope  Current Date: 07/10/2018 15:22:33  DOB: 1965-11-16 MRN: [id number] FIN: NBR%>[phone]  Patient Address: [address] [place] [address]  Patient Phone: [phone]  Primary Care Provider:  Name: Pearletha Alfred  Phone: [phone]   Immunizations Provided:      Discharge Diagnosis: Adhesive capsulitis of right shoulder; Osteoarthritis of right shoulder  Discharged To: TO, ANTICIPATED%>  Home Treatments: TREATMENTS, ANTICIPATED%>  Devices/Equipment: EQUIPMENT REHAB%>  Post Hospital Services: HOSPITAL SERVICES%>  Professional Skilled Services: SKILLED SERVICES%>  Therapist, sports and Community Resources: SERV AND COMM RES, ANTICIPATED%>  Mode of Discharge Transportation: TRANSPORTATION%>  Discharge Orders          Discharge Patient 07/10/18 14:58:00 EST        Comment:     Medications   During the course of your visit, your medication list was updated with the most current information. The details of those changes are reflected below:          New Medications  Printed Prescriptions  ondansetron (Zofran 4 mg oral tablet) 1-2 tabs Oral (given by mouth) every 6 hours as needed as needed for nausea/vomiting for 7 Days. Refills: 0.  Last Dose:____________________  oxyCODONE (oxyCODONE 5 mg oral tablet) 1-2 tabs Oral (given by mouth) every 6 hours as needed for pain for 5 Days. Refills: 0.  Last Dose:____________________  Medications That Have Not Changed  Other Medications  aspirin 81 Milligram Oral (given by mouth) once a day (in the evening).  Last Dose:____________________  cetirizine (ZyrTEC 10 mg oral tablet) Oral (given by mouth) once a day (in the evening).  Last Dose:____________________  rosuvastatin (Crestor 40 mg oral tablet) 1 Tabs Oral (given by mouth) once a day (in the evening).  Last  Dose:____________________        [facility] would like to thank you for allowing [place] to assist you with your healthcare needs. The following includes patient education materials and information regarding your injury/illness.    Tieken, Neilan C has been given the following list of follow-up instructions, prescriptions, and patient education materials:  Follow-up Instructions:              With: Address: When:   SCOTT EVANS-MD [address], [address] [place] [address]  [phone] Business (1)                    It is important to always keep an active list of medications available so that you can share with other providers and manage your medications appropriately. As an additional courtesy, we are also providing you with your final active medications list that you can keep with you.           aspirin 81 Milligram Oral (given by mouth) once a day (in the evening).  cetirizine (ZyrTEC 10 mg oral tablet) Oral (given by mouth) once a day (in the evening).  ondansetron (Zofran 4 mg oral tablet) 1-2 tabs Oral (given by mouth) every 6 hours as needed as needed for nausea/vomiting for 7 Days. Refills: 0.  oxyCODONE (oxyCODONE 5 mg oral tablet) 1-2 tabs Oral (given by mouth) every 6 hours as needed for pain for 5 Days. Refills: 0.  rosuvastatin (Crestor 40 mg oral tablet) 1 Tabs Oral (  given by mouth) once a day (in the evening).      Take only the medications listed above. Contact your doctor prior to taking any medications not on this list.      Discharge instructions, if any, will display below    Instructions for Diet: INSTRUCTIONS FOR DIET%>   Instructions for Supplements: SUPPLEMENT INSTRUCTIONS%>   Instructions for Activity: INSTRUCTIONS FOR ACTIVITY%>   Instructions for Wound Care: INSTRUCTIONS FOR WOUND CARE%>    Medication leaflets, if any, will display below     Patient education materials, if any, will display below           ON-Q*Catheter  Patient Guideline Insert    UNCLAMP @ 8PM    REMOVAL OF  CATHETER  If your doctor has instructed you to remove the catheter,   then follow their instructions keeping in mind these   key steps:?  Reyes Ivan your hands thoroughly with soap and warm water.   Dry thoroughly.  . Remove the dressing covering the catheter site.  . Remove any skin adhesive strips.  . Grasp the catheter close to the skin and gently pull on   the catheter. It should be easy to remove and not painful.   Do not tug or quickly pull on the catheter during removal.   If it becomes hard to remove or stretches, then STOP. Call   your doctor. Continued pulling could break the catheter.   . Do not cut or pull hard to remove the catheter.  . WARNING: After you remove the catheter, check   the catheter tip for the black marking to ensure the entire   catheter was removed. Call your doctor if you don't see   the black marking.  . Place a dressing over the catheter site as instructed by   your doctor.    LEAKING OF THE FLUID AROUND SITE IS COMMON.  YOU MUST WEAR SLING AT ALL TIMES WHILE BLOCK IN PLACE.    CALL DR [facility] FOR ANY PROBLEMS OR QUESTIONS  [phone]                 Discharge Instructions: After Your Surgery  You've just had surgery. During surgery you were given medicine called anesthesia to keep you relaxed and free of pain. After surgery you may have some pain or nausea. This is common. Here are some tips for feeling better and getting well after surgery.        Going home  Your doctor or nurse will show you how to take care of yourself when you go home. He or she will also answer your questions. Have an adult family member or friend drive you home. For the first 24 hours after your surgery:   Do not drive or use heavy equipment.   Do not make important decisions or sign legal papers.   Do not drink alcohol.   Have someone stay with you, if needed. He or she can watch for problems and help keep you safe.   In case of emergency call 911  Be sure to go to all follow-up visits with your doctor.  And rest after your surgery for as long as your doctor tells you to.  Coping with pain  If you have pain after surgery, pain medicine will help you feel better. Take it as told, before pain becomes severe. Also, ask your doctor or pharmacist about other ways to control pain. This might be with heat, ice, or relaxation. And  follow any other instructions your surgeon or nurse gives you.  Tips for taking pain medicine  To get the best relief possible, remember these points:   Pain medicines can upset your stomach. Taking them with a little food may help.   Most pain relievers taken by mouth need at least 20 to 30 minutes to start to work.   Taking medicine on a schedule can help you remember to take it. Try to time your medicine so that you can take it before starting an activity. This might be before you get dressed, go for a walk, or sit down for dinner.   Constipation is a common side effect of pain medicines. Call your doctor before taking any medicines such as laxatives or stool softeners to help ease constipation. Also ask if you should skip any foods. Drinking lots of fluids and eating foods such as fruits and vegetables that are high in fiber can also help. Remember, do not take laxatives unless your surgeon has prescribed them.   Drinking alcohol and taking pain medicine can cause dizziness and slow your breathing. It can even be deadly. Do not drink alcohol while taking pain medicine.   Pain medicine can make you react more slowly to things. Do not drive or run machinery while taking pain medicine.  Managing nausea  Some people have an upset stomach after surgery. This is often because of anesthesia, pain, or pain medicine, or the stress of surgery. These tips will help you handle nausea and eat healthy foods as you get better. If you were on a special food plan before surgery, ask your doctor if you should follow it while you get better. These tips may help:   Do not push yourself to eat. Your body  will tell you when to eat and how much.   Start off with clear liquids and soup. They are easier to digest.   Next try semi-solid foods, such as mashed potatoes, applesauce, and gelatin, as you feel ready.   Slowly move to solid foods. Don't eat fatty, rich, or spicy foods at first.   Do not force yourself to have 3 large meals a day. Instead eat smaller amounts more often.   Take pain medicines with a small amount of solid food, such as crackers or toast, to avoid nausea.     Call your surgeon if.   You still have intollerable pain an hour after taking medicine. The medicine may not be strong enough.   You feel too sleepy, dizzy, or groggy. The medicine may be too strong.   You have side effects like nausea, vomiting, or skin changes, such as rash, itching, or hives.      If you have obstructive sleep apnea  You were given anesthesia medicine during surgery to keep you comfortable and free of pain. After surgery, you may have more apnea spells because of this medicine and other medicines you were given. The spells may last longer than usual.   At home:   Keep using the continuous positive airway pressure (CPAP) device when you sleep. Unless your health care provider tells you not to, use it when you sleep, day or night. CPAP is a common device used to treat obstructive sleep apnea.   Talk with your provider before taking any pain medicine, muscle relaxants, or sedatives. Your provider will tell you about the possible dangers of taking these medicines.     [address] [employer] [address] [place], [place] [address]. All  rights reserved. This information is not intended as a substitute for professional medical care. Always follow your healthcare professional's instructions.                   SHOULDER ARTHROSCOPY-POSTOPERATIVE INSTRUCTIONS  Pain Medication:   Tylenol 1000 mg by mouth 3 times a day for 5 days.  Do not exceed more than 4000 mg of Tylenol/acetaminophen/APAP  from all  medications used in one day.   Over the counter anti-inflammatory medication such as Advil/ibuprofen (max dose 2400mg /day) OR Aleve/naproxen (max dose 1100 mg/day) in addition to Tylenol, if needed and tolerated   Use narcotic pain medications only for severe breakthrough pain that is not controlled by ice, Tylenol, and ibuprofen or naproxen.   The pain pump is to be removed in 72 hours. At that point, remove the dressings except for Steri-Strips over the incision. A Band-Aid may be applied if needed after the pump comes out.    Expect some clear and blood tinged drainage from the incisions and catheter site.  This is normal. Reinforce with extra bandages as needed.   Once the pain pump has been removed and the dressings are off, you may get the incisions wet in the shower  Icing:   Thermacare for ice therapy as directed.  May turn off compression as desired  OR   Ice pack/bag to shoulder every 30 minutes every few hours while awake  Sling:   Keep arm sling on except when performing the exercise below.   Come out of the sling to shower. Keep elbow by the side, or wear shower sling   Sleep in the sling    Follow Up Appointment:   Call for appointment in 5-7 days if not already scheduled.   Office [place] [phone]   Office [place] [phone]  Call office for temperature greater than 102 degrees Fahrenheit and for excessive swelling, pain, or redness around incision sites                Post-Operative Shoulder Exercises    You may remove the shoulder sling to perform the shoulder pendulum exercises.  You may also remove the shoulder sling to begin flexion and extension of the elbow and wrist.  Utilize a squeeze ball in your hand for active motion to minimize swelling.  Do not actively elevate or flex shoulder until advised.                                                       IS IT A STROKE? Act FAST and Check for these signs:    FACE                         Does the face look uneven?     ARM                         Does one arm drift down?    SPEECH                    Does their speech sound strange?    TIME                         Call 9-1-1 at  any sign of stroke  Heart Attack Signs  Chest discomfort: Most heart attacks involve discomfort in the center of the chest and lasts more than a few minutes, or goes away and comes back. It can feel like uncomfortable pressure, squeezing, fullness or pain.  Discomfort in upper body: Symptoms can include pain or discomfort in one or both arms, back, neck, jaw or stomach.  Shortness of breath: With or without discomfort.  Other signs: Breaking out in a cold sweat, nausea, or lightheaded.  Remember, MINUTES DO MATTER. If you experience any of these heart attack warning signs, call 9-1-1 to get immediate medical attention!     ---------------------------------------------------------------------------------------------------------------------  [facility] allows you to manage your health, view your test results, and retrieve your discharge documents from your hospital stay securely and conveniently from your computer.  To begin the enrollment process, visit [email or web address]. Click on "Sign up now" under [facility].

## 2018-07-10 NOTE — Op Note (Signed)
Phase II Record - RHOR             Phase II Record - RHOR Summary                                                                  Primary Physician:        Katha HammingEMARCO-MD,  JAMES R    Case Number:              [id number]    Finalized Date/Time:      07/10/18 16:08:44    Pt. Name:                 [name], Dale C    D.O.B./Sex:               [birth year]    Male    Med Rec #:                [id number]    Physician:                Katha HammingEMARCO-MD,  JAMES R    Financial #:              [id number]    Pt. Type:                 S    Room/Bed:                 /    Admit/Disch:              07/10/18 11:01:00 -    Institution:       RHOR Case Attendance - Phase II                                                                                           Entry 1                         Entry 2                                                                          Case Attendee             DEMARCO-MD,  Lacey JensenJAMES R            Hughes, RN, [facility]    Role Performed            Surgeon Primary                 Post Anesthesia Care  Nurse    Time In     Time Out     Last Modified By:         Kizzie Bane, RN, Ellwood Sayers, RN, Lupita Leash                              07/10/18 14:50:03               07/10/18 15:32:26      RHOR Case Attendance - Phase II Audit                                                            07/10/18 15:32:26         Owner: Forrestine Him                              Modifier: [email or web address]                                                       <+> 2         Case Attendee        <+> 2         Role Performed        RHOR - Case Times - Phase II                                                                                              Entry 1                                                                                                          Phase II In               07/10/18 15:26:00               Phase II Out                    07/10/18 16:08:00    Phase II Discharge         07/10/18 16:08:00    Time     Last Modified By:         Kizzie Bane,  RN, Lupita Leash                              07/10/18 16:08:40      RHOR - Case Times - Phase II Audit                                                               07/10/18 16:08:40         Owner: Forrestine Him                              Modifier: [email or web address]                                                       <+> 1         Phase II Out        <+> 1         Phase II Discharge Time                Finalized By: Merlyn Albert      Document Signatures                                                                             Signed By:           Kizzie Bane, RN, Lupita Leash 07/10/18 16:08

## 2018-07-10 NOTE — Op Note (Signed)
Phase I Record - RHOR             Phase I Record - RHOR Summary                                                                   Primary Physician:        Katha HammingEMARCO-MD,  JAMES R    Case Number:              [id number]    Finalized Date/Time:      07/10/18 15:32:14    Pt. Name:                 Dale Pope, Dale Pope    D.O.B./Sex:               Jan 29, 1966    Male    Med Rec #:                [id number]    Physician:                Katha HammingEMARCO-MD,  JAMES R    Financial #:              [id number]    Pt. Type:                 S    Room/Bed:                 /    Admit/Disch:              07/10/18 11:01:00 -    Institution:       RHOR Case Attendance - Phase I                                                                                            Entry 1                                                                                                          Case Attendee             Kizzie BaneHughes, RN, Lupita Leashonna               Role Performed                  Post Anesthesia Care  Nurse    Last Modified By:         Kizzie Bane, RN, [facility]                              07/10/18 14:50:14      RHOR - Case Times - Phase I                                                                                               Entry 1                                                                                                          Phase I In                07/10/18 14:56:00               Phase I Out                     07/10/18 15:26:00    Phase I Discharge         07/10/18 15:26:00    Time     Last Modified By:         Kizzie Bane, RN, Donna                              07/10/18 15:32:12              Finalized By: Kizzie Bane RN, Donna      Document Signatures                                                                             Signed By:           Kizzie Bane, RN, [facility] 07/10/18 15:32

## 2018-07-10 NOTE — Case Communication (Signed)
Discharge Follow-Up Form - Text       Discharge Follow-Up Entered On:  07/15/2018 14:42 EST    Performed On:  07/15/2018 14:42 EST by Marden NobleKEMP, RN, JUDY M               Clinical   Provider Follow-Up Post Discharge RTF :   Follow-Up Appointments    With:  [facility]  Address:  business (1), [address], [place], [place], [address];[phone] Business (1)       KEMP, RN, Bonnita LevanJUDY M - 07/15/2018 14:42 EST   Status   Case Status :   Incomplete   Phone Call History Post DC (Readmit) :   First call   TCM Call Outcome :   No answer/unable to reach   KEMP, RN, JUDY M - 07/15/2018 14:42 EST

## 2018-07-10 NOTE — Assessment & Plan Note (Signed)
PreOp Record - RHOR             PreOp Record - RHOR Summary                                                                     Primary Physician:        Katha Hamming R    Case Number:              [id number]    Finalized Date/Time:      07/10/18 12:54:31    Pt. Name:                 Dale Pope, Dale Pope    D.O.B./Sex:               1966/01/28    Male    Med Rec #:                [id number]    Physician:                Katha Hamming R    Financial #:              [id number]    Pt. Type:                 S    Room/Bed:                 /    Admit/Disch:              07/10/18 11:01:00 -    Institution:       RHOR Case Attendance - PreOp                                                                                              Entry 1                         Entry 2                         Entry 3                                          Case Attendee             DEMARCO-MD,  Cephas Darby, RN, Lemmie Evens, RN, CONSTANCE    Role Performed            Surgeon Primary                 Preoperative Nurse  Post Anesthesia Care                                                                                              Nurse Relief    Time In                                                                                   07/10/18 11:34:00    Time Out     Last Modified By:         Gillis EndsWetzstein, RN, Isla PenceShirley          Wetzstein, RN, Shirley          JEFFCOAT, RN, CONSTANCE                              07/10/18 11:19:11               07/10/18 11:19:11               07/10/18 11:34:53      RHOR Case Attendance - PreOp Audit                                                               07/10/18 11:34:53         Owner: Nelma RothmanWETZSTES                             Modifier: [email or web address]                                                        <+> 3         Case Attendee        <+> 3         Role Performed        <+> 3         Time In        RHOR - Case Times - PreOp  Entry 1                                                                                                          Patient In Room Time      07/10/18 11:19:00               Nurse In Time                   07/10/18 11:38:00    Nurse Out Time            07/10/18 11:59:00               Patient Ready for               07/10/18 12:54:00                                                              Surgery/Procedure     Last Modified By:         Gillis Ends, RN, Shirley                              07/10/18 12:54:29      RHOR - Case Times - PreOp Audit                                                                  07/10/18 12:54:29         Owner: JEFFCO                               Modifier: Nelma Rothman                                                      <+> 1         Patient Ready for Surgery/Procedure     07/10/18 11:59:56         Owner: JEFFCO                               Modifier: [email or web address]                                                        <+>  1         Nurse Out Time     07/10/18 11:42:33         Owner: Nelma Rothman                             Modifier: [email or web address]                                                        <+> 1         Nurse In Time                Finalized By: Gillis Ends RN, Talbert Forest      Document Signatures                                                                             Signed By:           Gillis Ends, RN, Shirley 07/10/18 12:54

## 2018-07-10 NOTE — Anesthesia Post-Procedure Evaluation (Signed)
Preanesthesia Evaluation Basic        Patient:   Dale Pope, Dale Pope            MRN: [id number]            FIN: [id number]               Age:   52 years     Sex:  Male     DOB:  [birth year]   Associated Diagnoses:   None   Author:   Laural Benes A      Preoperative Information   NPO:  NPO greater than 8 hours.    Anesthesia history     Patient's history: negative.     Family's history: negative.        Health Status   Allergies:    Allergic Reactions (Selected)  No Known Medication Allergies   Current medications:    Home Medications (3) Active  aspirin 81 mg, Oral, qPM  Crestor 40 mg oral tablet 40 mg = 1 tabs, Oral, qPM  ZyrTEC 10 mg oral tablet , Oral, qPM  ,    Medications (12) Active  Scheduled: (2)  ceFAZolin  2 g, IV Piggyback, On Call  sodium chloride 0.9% Inj Soln 10 mL syringe  30 mL, IV Push, q8hr  Continuous: (1)  Lactated Ringers Injection solution 1000 mL  1,000 mL, IV, 40 mL/hr  PRN: (9)  A Patient Specific Medication  1 EA, Kit-Combo, q54mn  A Patient Specific Refrigerated Medication  1 EA, Kit-Combo, q576m  Delivery and Return Bin Access  1 EA, Kit-Combo, q5m73m lidocaine 1% PF Inj Soln 2 mL  0.25 mL, ID, q5mi64mlidocaine 2% Topical Gel with applicator 10-122-02 1 app, Topical, q5min64mespiratory MDI Treatment  1 EA, Kit-Combo, q5min 14mdium chloride 0.9% Inj Soln 10 mL syringe  30 mL, IV Push, q5min  28mium chloride 0.9% Inj Soln 10 mL vial PF  30 mL, IV Push, q5min  s12mile water Inj Soln 10 mL  10 mL, N/A, q5min    50moblem list:    Active Problems (5)  Hx of melanoma of skin   Hyperlipidemia   OSA on CPAP   Seasonal allergies   Shoulder pain, right         Histories   Past Medical History:    No active or resolved past medical history items have been selected or recorded.   Procedure history:    Vasectomy ([id number]).  excision of melanoma.  left small finger bone spur removal.  throat polypectomy.   Social History        Social & Psychosocial  Habits    Alcohol  07/07/2018  Use: Current    Type: Wine    Frequency: 3-5 times per week    Substance Abuse  07/07/2018  Use: Denies    Tobacco  07/07/2018  Use: Former smoker, quit more    Type: Cigarettes  .        Physical Examination   Vital Signs   12/5/201954/2/7062T37:62olic Blood Pressure 120 mmHg 831Diastolic Blood Pressure 87 mmHg    Temperature Oral 36.7 degC    Heart Rate Monitored 52 bpm  LOW    Respiratory Rate 18 br/min    SpO2 99 %         Vital Signs (last 24 hrs)_____  Last Charted___________  Temp Oral     36.7 degC  (DEC 05 11:47)  Resp  Rate         18 br/min  (DEC 05 11:47)  SBP      120 mmHg  (DEC 05 11:47)  DBP      87 mmHg  (DEC 05 11:47)  SpO2      99 %  (DEC 05 11:47)  Weight      88.4 kg  (DEC 05 11:47)  Height      188 cm  (DEC 05 11:47)  BMI      25.01  (DEC 05 11:20)     Measurements from flowsheet : Measurements   07/10/2018 11:47 EST Height/Length Measured 188 cm    Weight Measured 88.4 kg    Weight Dosing 88.4 kg    Body Mass Index est meas 25.01   07/10/2018 11:20 EST Body Mass Index est meas 25.01 kg/m2    Body Mass Index Measured 25.01 kg/m2   07/10/2018 11:19 EST Patient Stated Height/Length 188 cm    Weight Measured 88.4 kg    Weight Dosing 88.4 kg      Pain assessment:  Pain Assessment   07/10/2018 11:47 EST      Numeric Rating Pain Scale 0 = No pain     .    General:          Stress: No acute distress.         Appearance: Within normal limits.    Airway:          Mallampati classification: III (soft palate, base of uvula visible).         Thyromental Distance: Normal.         Mouth: Teeth ( Chipped, Crown ).         Throat: Within normal limits.    Neck:  Full range of motion.       Review / Management   Results review:     No qualifying data available, Lab results   07/10/2018 11:49 EST Estimated Creatinine Clearance 124.21 mL/min   07/10/2018 11:20 EST Estimated Creatinine Clearance 124.21 mL/min       Assessment and Plan   American Society of Anesthesiologists#(ASA) physical  status classification:  Class II.    Anesthetic Preoperative Plan     Anesthetic technique: General anesthesia.     Maintenance airway: Laryngeal mask airway.     Risks discussed: nausea, vomiting, sore throat, dental injury, hypotension, serious complications.     Signature Line     Electronically Signed on 07/10/2018 12:33 PM EST   ________________________________________________   Laural Benes A

## 2018-07-10 NOTE — Op Note (Signed)
Operative Report               Date of service  July 10, 2018    Preoperative diagnosis  Right shoulder intra-articular synovitis  Date shoulder glenohumeral osteoarthrosis  Right shoulder adhesive capsulitis  Right shoulder type II SLAP tear  Right shoulder subacromial impingement syndrome  Right shoulder biceps tendinopathy         Postoperative diagnosis  Same as above       Procedure  Right shoulder subacromial decompression CPT code 45409  Right shoulder manipulation under anesthesia with intra-articular adhesions capsulotomy CPT code 81191  Right shoulder debridement of synovitis and superior labrum CPT code 47829  Right shoulder debridement of glenohumeral osteoarthrosis and removal of loose bodies greater than 1 cm 2  Right shoulder biceps tenodesis CPT code 56213       Surgeon  Dr. Crista Curb       Assistant  Sterling Big, PA    Anesthesia  Gen. with interscalene block       Anesthesiologist  Dr. Florence Canner       Estimated blood loss  None       Specimen  None       Complications  None       Indication  This is a 52 year old male who suffers long-standing pain in the right shoulder that has been unresponsive to conservative management. MRI shows subacromial impingement and biceps tendinopathy. The patient understands the risks and benefits of surgery and has signed the consent and wishes to proceed.       Findings  Pre-manipulation exam under anesthesia showed  Forward flexion 140 degrees  External rotation 75 degrees  Internal rotation 50 degrees    Postmanipulation exam under anesthesia showed  Forward flexion 160 degrees  External rotation 90 degrees  Internal rotation 60 degrees  Behind the back T10    The rotator cuff was normal.  Biceps tendon showed injury at the superior labral biceps tendon interface.  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  Glenoid chondral surface showed grade 4 chondromalacia of the majority of the surface.  Labrum showed type II SLAP  tear with instability of the biceps anchor.  Rotator Interval was normal.  Capsular structures showed severe global synovitis with hemosiderin staining.  Multiple loose bodies 2 which were greater than 1 cm.  Subacromial bursal sac showed severe thickening.  Acromion was a Type 2.  AC joint was normal.       Implants  Arthrex proximal biceps button with #2 FiberWire       Procedure  My physician assistant Sterling Big, PA-C was medically necessary for successful implementation and completion of this case. She was involved with the entirety of the case including assessment and intake of the patient in the holding room, safe transfer of the patient to the operating room table. She was utilized and assisted in prepping and draping. But most importantly she performed arthroscopic procedures that are not typical of an untrained surgical assistant. She assisted and manipulated the arthroscope as well as arthroscopic graspers, biters and suture passers allowing me to do my portion of the surgery arthroscopically with more efficiency. She was also involved with closure of the incisions, dressings and safe transfer of the patient to the holding area. Because of her assistance, the surgical case progressed more efficiently and the patient was exposed to less general anesthesia, less bleeding and less time of having open incisions.  The patient was brought  to the operating room and induced with general anesthesia after receiving an interscalene block. IV antibiotics were given and DVT prophylaxis was applied to the legs. A timeout was called and confirmed. Exam under anesthesia was performed. The findings are as noted above. The patient's arm was then taken through a gentle manipulation first with forward flexion, then with external rotation at the side. This was followed by internal and external rotation at all levels of abduction, crossover maneuver was performed and then a behind the back maneuver. Joint mobilizations  were then performed to ensure as much mobility in the shoulder capsule as possible. Postmanipulation exam as documented above. The patient's arm was then double prepped and draped in the usual sterile manner. Standard anterior and posterior portals were established into the joint space. The joint was examined systematically and the findings are noted above. Debridement was carried out in the joint with a combination of a 5.0 mm shaver and a 90 degree ArthroCare wand.  A complete debridement of the anterior, inferior, posterior and superior joint was carried out including complete synovectomy and release of adhesions superiorly, anteriorly and posteriorly.  In addition global labral debridement was carried out anteriorly, posterior, superiorly and inferiorly.  The rotator interval was opened up in the middle glenohumeral ligament was released.  The biceps was examined, as well as its attachment to the superior labrum.  Beforehand exiting the joint a needle was passed through the long head of the biceps and the biceps was later tenotomized off the superior labrum.  This was done to maintain the tendon length.  Beforehand exiting we also took out to loose bodies greater than 1 cm each.  These were successfully passed out of the joint.  Glenohumeral joint surfaces were debrided.  Once completed within the joint we went into the subacromial space. Debridement of the bursa was carried out with the shaver and ArthroCare wand.  The biceps was located in the bicipital groove from the bursal side with the ArthroCare wand.  An Arthrex FiberWire suture was passed through the tendon.  We then went back into the joint space and tenotomized the biceps off the superior labrum.  Subsequently we placed the camera back into the subacromial space and dissected the biceps out of the groove by unroofing the transverse ligament. A small distal incision was utilized and the biceps was taken out along the mid arm area and whipstitched with  the fiber loop. We then brought the tendon back into the arm and then out a proximal cannula to place the proximal biceps button on the sutures. The button was then passed into a 3.2 mm drill hole and flipped. The sutures were then tied down. Final pictures were taken.  The rotator cuff was examined. The CA ligament anteriorly was subsequently taken down and with a 5 mm bone cutter gentle acromial smoothing was performed utilizing a posterior chamfer technique.  The acromioclavicular joint was examined and found to be within normal limits.  Prior to closure we performed another stretching and manipulation under anesthesia.  We irrigated out the subacromial space and placed a pain pump catheter under direct visualization into the space. We primed the catheter and closed the portals with 3-0 nylon and large Steri-Strips. The wounds were dressed sterilely and the arm was placed in a sling. We then brought the patient to the recovery room in stable condition. Sponge and needle counts were correct.      Games developerignature Line     Electronically Signed on 07/10/2018 03:06  PM EST   ________________________________________________   Edwin Cap

## 2018-07-10 NOTE — Procedures (Signed)
IntraOp Record - [facility]             IntraOp Record - [facility] Summary                                                                   Primary Physician:        Weston Brass R    Case Number:              [phone]    Finalized Date/Time:      07/16/18 10:06:35    Pt. Name:                 Dale Pope, Dale Pope    D.O.B./Sex:               30-Dec-1965    Male    Med Rec #:                [id number]    Physician:                Weston Brass R    Financial #:              [id number]    Pt. Type:                 S    Room/Bed:                 /    Admit/Disch:              07/10/18 11:01:00 -                              07/10/18 16:08:00    Institution:       [facility] - Case Times                                                                                                         Entry 1                                                                                                          Patient      In Room Time             07/10/18 13:00:00               Out Room  Time                   07/10/18 14:55:00    Anesthesia     Procedure      Start Time               07/10/18 13:26:00               Stop Time                       07/10/18 14:45:00    Last Modified By:         Martie Round RN, Caroline                              07/10/18 15:06:02      [facility] - Case Times Audit                                                                          07/10/18 15:06:02         Owner: [id number]                              Modifier: [id number]                                                       <+> 1         Out Room Time     07/10/18 14:45:56         Owner: [id number]                              Modifier: [id number]                                                       <+> 1         Stop Time     07/10/18 13:26:52         Owner: [id number]                              Modifier: [id number]                                                       <+> 1         Start Time        [facility] - Case Attendance  Entry 1                         Entry 2                         Entry 3                                          Case Attendee             SILVIA-MD,  KENNETH A           DEMARCO-MD,  Marvene Staff, RN, Philis Pique    Role Performed            Anesthesiologist                Surgeon Primary                 Circulator Add'l    Time In                   07/10/18 13:00:00               07/10/18 13:00:00               07/10/18 13:00:00    Time Out                                                                                  07/10/18 13:01:00    Procedure                 Shoulder Arthroscopy            Shoulder Arthroscopy            Shoulder Arthroscopy                              with Repair(Right),             with Repair(Right),             with Repair(Right),                              Closed Manipulation or          Closed Manipulation or          Closed Manipulation or                              Injection (Name                 Injection (Name                 Injection (Name                              S(Right), Shoulder  or           S(Right), Shoulder or           S(Right), Shoulder or                              Upper Extremity Tendon          Upper Extremity Tendon          Upper Extremity Tendon                              Repai                           Repai                           Repai    Last Modified By:         Martie Round, RN, Georgette Shell, RN, Georgette Shell, RN, [facility]                              07/10/18 14:39:31               07/10/18 14:39:31               07/10/18 14:39:31                                Entry 4                         Entry 5                         Entry 6                                          Case Attendee             Sharlene Dory, RN, Maryjean Ka,  Rutherford Nail    Role Performed            First Assistant                  Circulator                      Surgical Scrub    Time In                   07/10/18 13:00:00               07/10/18 13:00:00               07/10/18 13:00:00    Time Out     Procedure  Shoulder Arthroscopy            Shoulder Arthroscopy            Shoulder Arthroscopy                              with Repair(Right),             with Repair(Right),             with Repair(Right),                              Closed Manipulation or          Closed Manipulation or          Closed Manipulation or                              Injection (Name                 Injection (Name                 Injection (Name                              S(Right), Shoulder or           S(Right), Shoulder or           S(Right), Shoulder or                              Upper Extremity Tendon          Upper Extremity Tendon          Upper Extremity Tendon                              Repai                           Repai                           Repai    Last Modified By:         Martie Round, RN, Georgette Shell, RN, Georgette Shell, RN, [facility]                              07/10/18 14:39:31               07/10/18 14:39:31               07/10/18 14:39:31                                Entry 7                         Entry 8  Case Attendee             Rodney Cruise, RN, [facility]    Role Performed            [place]                        [place]    Time In                   07/10/18 13:00:00               07/10/18 13:00:00    Time Out     Procedure                 Shoulder Arthroscopy            Shoulder Arthroscopy                              with Repair(Right),             with Repair(Right),                              Closed Manipulation or          Closed Manipulation or                              Injection (Name                 Injection (Name                              S(Right),  Shoulder or           S(Right), Shoulder or                              Upper Extremity Tendon          Upper Extremity Tendon                              Repai                           Repai    Last Modified By:         Martie Round, RN, Georgette Shell, RN, [facility]                              07/10/18 14:39:31               07/10/18 14:39:31      RH[facility] - Case Attendance Audit  07/10/18 14:39:31         Owner: [id number]                              Modifier: [id number]                                                           1     <*> Procedure                              Shoulder Arthroscopy with Repair(Right), Closed Manipulation or                                                             Injection (Name S(Right)            2     <*> Procedure                              Shoulder Arthroscopy with Repair(Right), Closed Manipulation or                                                             Injection (Name S(Right)            3     <*> Procedure                              Shoulder Arthroscopy with Repair(Right), Closed Manipulation or                                                             Injection (Name S(Right)            4     <*> Procedure                              Shoulder Arthroscopy with Repair(Right), Closed Manipulation or                                                             Injection (Name S(Right)            5     <*> Procedure  Shoulder Arthroscopy with Repair(Right), Closed Manipulation or                                                             Injection (Name S(Right)            6     <*> Procedure                              Shoulder Arthroscopy with Repair(Right), Closed Manipulation or                                                             Injection (Name S(Right)            7     <*> Procedure                              Shoulder Arthroscopy with Repair(Right),  Closed Manipulation or                                                             Injection (Name S(Right)            8     <*> Procedure                              Shoulder Arthroscopy with Repair(Right), Closed Manipulation or                                                             Injection (Name S(Right)     07/10/18 13:28:39         Owner: [id number]                              Modifier: [id number]                                                           1     <*> Procedure                              Shoulder Arthroscopy with Repair(Right), Closed Manipulation or  Injection (Name S(Right)            2     <*> Procedure                              Shoulder Arthroscopy with Repair(Right), Closed Manipulation or                                                             Injection (Name S(Right)            3     <*> Procedure                              Shoulder Arthroscopy with Repair(Right), Closed Manipulation or                                                             Injection (Name S(Right)            4     <+> Time In            4     <*> Procedure                              Shoulder Arthroscopy with Repair(Right), Closed Manipulation or                                                             Injection (Name S(Right)            5     <+> Time In            5     <*> Procedure                              Shoulder Arthroscopy with Repair(Right), Closed Manipulation or                                                             Injection (Name S(Right)            6     <+> Time In            6     <*> Procedure                              Shoulder Arthroscopy with Repair(Right), Closed Manipulation or  Injection (Name S(Right)            7     <+> Time In            7     <*> Procedure                              Shoulder Arthroscopy with Repair(Right), Closed Manipulation  or                                                             Injection (Name S(Right)            8     <+> Time In            8     <*> Procedure                              Shoulder Arthroscopy with Repair(Right), Closed Manipulation or                                                             Injection (Name S(Right)     07/10/18 13:26:16         Owner: [id number]                              Modifier: [id number]                                                           1     <+> Time In            1     <*> Procedure                              Shoulder Arthroscopy with Repair(Right), Closed Manipulation or                                                             Injection (Name S(Right)            2     <+> Time In            2     <*> Procedure                              Shoulder Arthroscopy with Repair(Right), Closed Manipulation or  Injection (Name S(Right)        <+> 3         Case Attendee        <+> 3         Role Performed        <+> 3         Time In        <+> 3         Time Out        <+> 3         Procedure        <+> 4         Case Attendee        <+> 4         Role Performed        <+> 4         Procedure        <+> 5         Case Attendee        <+> 5         Role Performed        <+> 5         Procedure        <+> 6         Case Attendee        <+> 6         Role Performed        <+> 6         Procedure        <+> 7         Case Attendee        <+> 7         Role Performed        <+> 7         Procedure        <+> 8         Case Attendee        <+> 8         Role Performed        <+> 8         Procedure        [facility] - Skin Assessment                                                                          Pre-Care Text:            A.240 Assesses baseline skin condition Im.120 Implements protective measures to prevent skin or tissue injury           due to mechanical sources  Im.280.1 Implements progective measures to prevent skin or  tissue injury due to           thermal sources Im.360 Monitors for signs and symptons of infection                              Entry 1  Skin Integrity            Intact    Last Modified By:         Martie Round, RN, Chrys Racer                              07/10/18 13:46:40    Post-Care Text:            E.10 Evaluates for signs and symptoms of physical injury to skin and tissue E.270 Evaluate tissue perfusion           O.60 Patient is free from signs and symptoms of injury caused by extraneous objects   O.210 Patinet's tissue           perfusion is consistent with or improved from baseline levels      [facility] - Patient Positioning                                                                      Pre-Care Text:            A.240 Assesses baseline skin condition A.280 Identifies baseline musculoskeletal status A.280.1 Identifies           physical alterations that require additional precautions for procedure-specific positioning A.510.8 Maintains           patient's dignity and privacy Im.120 Implements protective measures to prevent skin/tissue injury due to           mechanical sources Im.40 Positions the patient Im.80 Applies safety devices                              Entry 1                                                                                                          Procedure                 Shoulder Arthroscopy            Body Position                   [place]                              with Repair(Right),                              Closed Manipulation or                              Injection (Name  S(Right), Shoulder or                              Upper Extremity Tendon                              Repai    Left Arm Position         Flexed on Padded Arm            Right Arm Position              Held on [employer] w/Security Strap    Left  Leg Position         Flexed, Padding Under           Right Leg Position              Flexed, Padding Under                              Knee                                                            Knee    Feet Uncrossed            Yes                             Pressure Points                 Yes                                                              Checked     Additional                beach chair with spider         Positioning Device              Foam Wedge, Arm Cradle    Information               limb positioner                                                 Foam/Gel, Beach                              attachment, foam wedge  Chair/Shoulder Table,                              under lower legs                                                Head Rest Foam, Safety                                                                                              Strap, Armsled Padded    Positioned By             Stark Klein,          Outcome Met (O.80)              Yes                              DEMARCO-MD,  Mayra Neer, RN, Eric R    Last Modified By:         Martie Round, RN, [facility]                              07/10/18 14:39:34    Post-Care Text:            A.240 Assesses baseline skin condition A.280 Identifies baseline musculoskeletal status A.280.1 Identifies           physical alterations that require additional precautions for procedure-specific positioning A.510.8 Maintains           patient's dignity and privacy Im.120 Implements protective measures to prevent skin/tissue injury due to           mechanical sources Im.40 Positions the patient Im.80 Applies safety devices      [facility] - Patient Positioning Audit                                                                 07/10/18 14:39:34         Owner: [id number]                              Modifier: [id number]  1      <*> Procedure                              Shoulder Arthroscopy with Repair(Right), Closed Manipulation or                                                             Injection (Name S(Right)        [facility] - Skin Prep                                                                                Pre-Care Text:            A.30 Verifies allergies A.20 Verifies procedure, surgical site, and laterality A.510.8 Maintains paritnet's           dignity and privacy Im.270 Performs Skin Preparation Im.270.1 Implements protective measures to prevent skin           and tissue injury due to chemical sources  A.300.1 Protects from cross-contamination                              Entry 1                                                                                                          Hair Removal     Skin Prep      Prep Agents (Im.270)     Chlorhexidine Gluconate         Prep Area (Im.270)              Shoulder                              2% w/Alcohol     Prep Area Details        Right                           Prep By                         Weston Brass R    Outcome Met (O.100)       Yes    Last Modified By:         Martie Round, RN, Chrys Racer  07/10/18 13:47:26    Post-Care Text:            E.10 Evaluates for signs and symptoms of physical injury to skin and tissue O.100 Patient is free from signs           and symptoms of chemical injury  O.740 The patient's right to privacy is maintained      [facility] - Counts Initial and Final                                                                 Pre-Care Text:            A.20.2 - Assesses the risk for unintended retained surgical items Im.20 - Performs required counts                              Entry 1                                                                                                          Initial Counts      Initial Counts           Brett Albino, RN, Lilia Pro B,         Items included in               Sponges, Sharps     Performed By              Christella Noa                the Initial Count     Final Counts      Final Counts             Martie Round, RN, Chrys Racer,           Final Count Status              Correct     Performed By             Graciella Belton     Items Included in        Sponges, Sharps     Final Count     Outcome Met (O.20)        Yes    Last Modified By:         Martie Round, RN, Chrys Racer                              07/10/18 14:37:15    Post-Care Text:            E.50 - Evaluates results of the surgical count O.20 - Patient is free from unintended retained surgical items      RH[facility] - Counts Initial and Final Audit  07/10/18 14:37:15         Owner: [id number]                              Modifier: [id number]                                                           1     <*> Final Counts Performed By              Christella Noa            1     <+> Outcome Met (O.20)        [facility] - General Case Data                                                                        Pre-Care Text:            A.350.1 Classifies surgical wound                              Entry 1                                                                                                          Case Information      ASA Class                3                               Case Level                      Level 3     OR                       RH1 02                          Specialty                       Orthopedic (SN)     Wound Class              No Incision    Preop Diagnosis           M19.011,M75.01                  Postop Diagnosis  M19.011,M75.01    Last Modified By:         Martie Round, RN, Chrys Racer                              07/10/18 13:27:39    Post-Care Text:            O.760 Patient receives consistent and comparable care regardless of the setting      [facility] - Fire Risk Assessment                                                                                               Entry 1                                                                                                           Fire Risk                 Alcohol Based Prep              Fire Risk Score                 3    Assessment: If            Solution, Surgical Site    checked, checkmark        Above Xiphoid, Ignition    = 1 point                 Source In Use    Last Modified By:         Martie Round, RN, Chrys Racer                              07/10/18 13:33:53      RH[facility] - Time Out                                                                                 Pre-Care Text:            A.10 Confirms patient identity A.20 Verifies operative procedure, surgical site, and laterality A.20.1 Verifies           consent for planned procedure A.30 Verifies allergies  Entry 1                                                                                                          Procedure                 Shoulder Arthroscopy            Is everyone ready               Yes                              with Repair(Right),             to perform time out                               Closed Manipulation or                              Injection (Name                              S(Right), Shoulder or                              Upper Extremity Tendon                              Repai    Have all members of       Yes                             Patient name and                Yes    the surgical team                                         DOB confirmed     been introduced     Allergies discussed       Yes                             Surgical procedure              Yes                                                              to be performed  confirmed and                                                               verified by                                                               completed surgical                                                               consent      Correct surgical          Yes                             Correct laterality              Yes    site marked and                                           confirmed     initials are     visible through     prepped and draped     field (or     alternative ID band     used)     Correct patient           Yes                             Surgeon shares                  Yes    position confirmed                                        operative plan,                                                               possible                                                               difficulties,  expected duration,                                                               anticipated blood                                                               loss and reviews                                                               all                                                               critical/specific                                                               concerns     Required blood            Yes                             Essential imaging               Yes    products, implants,                                       available and fetal     devices and/or                                            heartones confirmed     special equipment                                         (if applicable)     available and     sterility confirmed     VTE prophylaxis           Yes                             Antibiotics ordered             Yes  addressed                                                 and administered     Anesthesia shares         Yes                             Fire risk                       Yes    anesthetic plan and                                       assessment scored     reviews patient                                           and plan discussed     specific concerns     Appropriate drying        Yes                              Surgeon states:                 Yes    time for prep                                             Does anyone have     observed before                                           any concerns? If     draping                                                   you see, suspect,                                                               or feel that                                                               patient care is  being compromised,                                                               speak up for                                                               patient safety     Time Out Complete         07/10/18 13:25:00    Last Modified By:         Martie Round, RNChrys Racer                              07/10/18 14:39:34    Post-Care Text:            E.30 Evaluates verification process for correct patient, site, side, and level surgery      [facility] - Time Out Audit                                                                            07/10/18 14:39:34         Owner: [id number]                              Modifier: [id number]                                                           1     <*> Procedure                              Shoulder Arthroscopy with Repair(Right), Closed Manipulation or                                                             Injection (Name S(Right)        [facility] - Debrief  Pre-Care Text:            Im.330 Manages specimen handling and disposition                              Entry 1                                                                                                          Procedure                 Shoulder Arthroscopy            Final counts                    Yes                              with Repair(Right),             correct and                               Closed Manipulation or          verbally verified                                Injection (Name                 with                               S(Right), Shoulder or           surgeon/licensed                               Upper Extremity Tendon          independent                               Repai                           practitioner (if                                                               applicable)     Actual procedure          Yes                             Postop  diagnosis                Yes    performed confirmed                                       confirmed     Wound                     Yes                             Confirm specimens               Yes    classification                                            and specimens     confirmed                                                 labeled                                                               appropriately (if                                                               applicable)     Foley catheter            Yes                             Patient recovery                Yes    removed (if                                               plan confirmed     applicable)     Debrief Complete          07/10/18 14:37:00    Last Modified By:         Martie Round, RN, Chrys Racer                              07/10/18 14:39:35    Post-Care Text:            E.800 Ensures continuity of care E.50 Evaluates results of the surgical count O.30 Patient's procedure is           performed on the correct site, side, and level O.50  patient's current status is communicated throughout the           continuum of care O.40 Patient's specimen(s) is managed in the appropriate manner      [facility] - Debrief Audit                                                                             07/10/18 14:39:35         Owner: [id number]                              Modifier: [id number]                                                           1     <*> Procedure                              Shoulder Arthroscopy with Repair(Right),  Closed Manipulation or                                                             Injection (Name S(Right)     07/10/18 14:38:08         Owner: [id number]                              Modifier: [id number]                                                           1     <*> Procedure                              Shoulder Arthroscopy with Repair(Right), Closed Manipulation or                                                             Injection (Name S(Right)            1     <+> Debrief Complete        RH[facility] - Cautery  Pre-Care Text:            A.240 Assesses baseline skin condition A280.1 Identifies baseline musculoskeletal status Im.50 Implements           protective measures to prevent injury due to electrical sources  Im.60 Uses supplies and equipment within safe           parameters Im.80 Applies safety devices                              Entry 1                                                                                                          ESU Type                  GENERATOR ARTHROWAND            Identification                  [id number]                                                              Number     Coag Setting (watts)      7                               Cut Setting (watts)             1    Grounding Pad             No                              Outcome Met (O.10)              Yes    Needed?     Last Modified By:         Martie Round, RN, [facility]                              07/10/18 13:31:03    Post-Care Text:            E.10 Evaluates for signs and symptoms of physical injury to skin and tissue O.10 Patient is free from signs and           symptoms of injury related to thermal sources  O.70 Patient is free from signs and symptoms of electrical injury      [facility] - Patient Care Devices  Pre-Care Text:            A.200 Assesses risk for normothermia regulation  A.40 Verifies presence of prosthetics or corrective devices           Im.280 Implements thermoregulation measures Im.60 Uses supplies and equipment within safe parameters                              Entry 1                         Entry 2                                                                          Equipment Type            MACHINE SEQUENTIAL              BAIR HUGGER                              COMPRESSION    SCD Sleeve Site           Legs Bilateral    Equipment/Tag Number      [id number]                           [phone]    Initiated Pre             Yes    Induction     Last Modified By:         Martie Round, RN, Georgette Shell, RN, [facility]                              07/10/18 13:40:21               07/10/18 13:40:21    Post-Care Text:            E.10 Evaluates signs and symptoms of physical injury to skin and tissue O.60 Patient is free from signs and           symptoms of injury caused by extraneous objects      [facility] - Medications                                                                              Pre-Care Text:            A.10 Confirms patient identity A.30 Verifies allergies Im.220 Administers prescribed medications Im.220.2           Administers prescribed antibiotic therapy as ordered  Entry 1                         Entry 2                         Entry 3                                          Time Administered     Medication                LIDOCAINE 1% W/EPI 20ML         EPINEPRINE 1:1000 - 30CC        ROPIVACAINE HCL                              VIAL                                                            INJECTION '5MG'$ /ML  30 ML    Route of Admin            Local Injection                 Irrigation                      Local Injection    Dose/Volume               20 ml                           3 ml                            30 ml    (include amount and     unit of measure)     Site                      Shoulder                        Shoulder                         Shoulder    Site Detail               Right                           Right                           Right    Administered By           Elease Hashimoto            DEMARCO-MD,  JAMES R            DEMARCO-MD,  JAMES R    Outcome Met (O.130)       Yes  Yes                             Yes    Last Modified By:         Martie Round, RN, Georgette Shell, RN, Georgette Shell, RN, Chrys Racer                              07/10/18 13:37:29               07/10/18 13:37:29               07/10/18 13:37:29    Post-Care Text:            E.20 Evaluates response to medications O.130 Patient receives appropriately administerd medication(s)    General Comments:            3 ml of epi added to 3000 ml of LR for irrigation      [facility] - Implants/Endoscopy Stents                                                                Pre-Care Text:            A.68 Verifies operative procedure, surgical site, and laterality A.20.1 Verifies consent for planned procedure           Im.350 Records implants inserted during the operative or invasive procedure                              Entry 1                                                                                                          Implant/Explant           Implant                         Catalog #                      [id number]    Implant     Identification      Description              KIT REPAIR IMPLANT              Expiration Date                 03/06/23  PROXIMAL BICEPS                              [id number]     Lot Number               [id number]                       Manufacturer                    [employer]    Usage Data      Implant Site             R shoulder                      Quantity                        1    Last Modified By:         Martie Round, RN, Chrys Racer                              07/10/18 13:54:35    Post-Care Text:            E.30 Evaluates verification process for correct  patient, site, side and level surgery O.30 Patient's procedure           is performed on the correct site, side, and level      [facility] - Tubes, Drains, Catheters                                                                 Pre-Care Text:            A.310 Identifies factors associated with an increased risk for hemorrhage or fluid and electrolyte imbalance           Im.250 Administers care to invasive device sites                              Entry 1                                                                                                          Device Description        CATH ON-Q SILVERSOAKER          Location                        Shoulder                              5IN EXPANSION KIT  Location Detail           Right    Last Modified By:         Martie Round, RN, Chrys Racer                              07/10/18 14:41:57    Post-Care Text:            E.340 Evaluates tubes and drains are intact and functioning as planned O.60 Patient is free from signs and           symptoms of injury caused by extraneous objects      [facility] - Dressing/Packing                                                                         Pre-Care Text:            A.350 Assesses susceptibility for infection Im.250 Administers care to invasive devices Im.290 Administer care           to wound sites  Im.300 Implements aseptic technique                              Entry 1                                                                                                          Site                      Shoulder                        Site Details                    Right    Dressing Item     Details      Dressing Item            Wound Closure Strip,            Miscellaneous                   Cold Therapy Pad,     (Im.290)                 4x4's, ABD, Tape                (Im.290)                        Shoulder  Immobilizer/Sling    Last Modified By:          Martie Round, RN, Chrys Racer                              07/10/18 13:52:52    Post-Care Text:            E.320 Evaluate factors associted with increased risk for postoperative infection at the completion of the           procedure O.200 Patient's wound perfusion is consistent with or improved from baseline levels  O.Patient is           free from signs and symptoms of infection      [facility] - Procedures                                                                               Pre-Care Text:            A.20 Verifies operative procedure, surgical site, and laterality Im.150 Develops individualized plan of care                              Entry 1                         Entry 2                         Entry 3                                          Procedure     Description      Procedure                Shoulder Arthroscopy            Closed Manipulation or          Shoulder or Upper                              with Repair                     Injection (Name Site)           Extremity Tendon Repair                                                                                              Proximal Open     Modifiers                Right  Right     Surgical Procedure       RIGHT SHOULDER MUA WITH         RIGHT SHOULDER MUA     Text                     ARTHROSCOPIC DEBRIDMENT                              REMOVAL OF LOOSE                              BODIES, SAD, PROXIMAL                              BICEPS TENODESIS    Primary Procedure         Yes                             No                              No    Primary Surgeon           DEMARCO-MD,  Elyn Aquas            DEMARCO-MD,  Tobie Poet    Start                     07/10/18 13:26:00               07/10/18 13:26:00               07/10/18 13:26:00    Stop                      07/10/18 14:45:00               07/10/18 14:45:00               07/10/18 14:45:00    Anesthesia Type           General                          General                         General    Surgical Service          Orthopedic (SN)                 Orthopedic (SN)                 Orthopedic (SN)    Wound Class               1-Clean                         No Incision                     1-Clean    Last Modified By:         Martie Round, RN, Chrys Racer  Darden Palmer,  Detar North                              07/10/18 14:53:24               07/10/18 14:53:24               07/16/18 10:06:27    Post-Care Text:            O.730 The patinet's care is consistent with the individualized perioperative plan of care      [facility] - Procedures Audit                                                                          07/16/18 10:06:27         Owner: [id number]                              Modifier: [email or web address]                                                            3     <*> Procedure                              Shoulder or Upper Extremity Tendon Repair Proximal Open            3     <*> Surgical Service     07/10/18 14:53:24         Owner: [id number]                              Modifier: [id number]                                                       <+> 1         Stop        <+> 2         Stop        <+> 3         Stop     07/10/18 14:41:36         Owner: [id number]                              Modifier: [id number]                                                       <+>  3         Start     07/10/18 14:39:19         Owner: [id number]                              Modifier: [id number]                                                       <+> 3         Procedure        <+> 3         Primary Procedure        <+> 3         Primary Surgeon        <+> 3         Surgical Service        <+> 3         Wound Class        <+> 3         Anesthesia Type     07/10/18 14:13:06         Owner: [id number]                              Modifier: [id number]                                                           1     <*> Procedure                              Shoulder Arthroscopy with  Repair            1     <*> Surgical Procedure Text                RIGHT SHOULDER MUA WITH ARTHROSCOPIC DEBRIDMENT REMOVAL OF LOOSE                                                             BODIES, SAD        RH[facility] - Transfer                                                                                                           Entry 1  Transferred By            Stark Klein,          Via                             Richardson Dopp, RN, Hilton Cork, RN, [facility]    Post-op Destination       PACU    Skin Assessment      Condition                Intact    Last Modified By:         Martie Round RNChrys Racer                              07/10/18 13:47:54      Case Comments                                                                                         <None>              Finalized By: Laqueta Jean      Document Signatures                                                                             Signed By:           Martie Round RN, Caroline 07/10/18 15:07          Clio,  [place] 07/16/18 10:06      Unfinalized History                                                                                     Date/Time            Username    Reason for Unfinalizing         Freetext Reason for Unfinalizing                                          07/16/18  10:05       MALHIS      Charging

## 2018-07-10 NOTE — Case Communication (Signed)
Discharge Follow-Up Form - Text       Discharge Follow-Up Entered On:  07/22/2018 11:45 EST    Performed On:  07/22/2018 11:45 EST by Dorita FrayGillet, RN, Frederik Schmidtourtney L               Clinical   Provider Follow-Up Post Discharge RTF :   Follow-Up Appointments    [facility]  [facility]  Address:  business (1), [address], [place], [place], [address];[phone] Business (1)       Big LakeGillet, RN, Frederik SchmidtCourtney L - 07/22/2018 11:45 EST   Status   Case Status :   Incomplete   Reason for Removal :   Over 10 days from discharge   Morey HummingbirdGillet, RN, Courtney L - 07/22/2018 11:45 EST

## 2018-07-10 NOTE — Anesthesia Post-Procedure Evaluation (Signed)
Postanesthesia Evaluation - Normal        Patient:   Dale Pope, [name]            MRN: [id number]            FIN: [phone]               Age:   52 years     Sex:  Male     DOB:  12-20-1965   Associated Diagnoses:   None   Author:   Sinda DuSILVIA-MD,  Randolph Paone A      Postoperative Information   Post Operative Info:          Post operative day: Post Anesthesia Care Unit.         Patient location: PACU.         Notes: No Anesthesia Complications.       Assessment   Postanesthesia assessment   Vitals: Vital Signs   07/10/2018 14:56 EST Systolic Blood Pressure 124 mmHg    Diastolic Blood Pressure 69 mmHg    Temperature Oral 36.4 degC    Heart Rate Monitored 73 bpm    Respiratory Rate 17 br/min    SpO2 92 %      .     Respiratory function: Respiratory rate, airway, and oxygen saturation are at adequate levels.     Cardiovascular function: Heart Rate stable, Blood Pressure stable, Postoperative hydration status Adequate.     Mental status: appropriate for level of anesthesia.     Temperature: within normal limits.     Pain Control: Adequate.     Nausea/Vomiting: Absent.     Signature Line     Electronically Signed on 07/10/2018 03:10 PM EST   ________________________________________________   Sinda DuSILVIA-MD,  Lacosta Hargan A

## 2019-01-08 DIAGNOSIS — D229 Melanocytic nevi, unspecified: Secondary | ICD-10-CM

## 2019-01-08 HISTORY — DX: Melanocytic nevi, unspecified: D22.9

## 2019-05-16 LAB — COVID-19: SARS-CoV-2: NOT DETECTED

## 2019-08-10 ENCOUNTER — Encounter (INDEPENDENT_AMBULATORY_CARE_PROVIDER_SITE_OTHER): Payer: Self-pay | Admitting: *Deleted

## 2019-09-16 ENCOUNTER — Ambulatory Visit (INDEPENDENT_AMBULATORY_CARE_PROVIDER_SITE_OTHER): Payer: Self-pay | Admitting: Gastroenterology

## 2019-10-17 ENCOUNTER — Ambulatory Visit: Payer: Self-pay | Attending: Internal Medicine

## 2019-10-17 DIAGNOSIS — Z23 Encounter for immunization: Secondary | ICD-10-CM

## 2019-10-17 NOTE — Progress Notes (Signed)
   Covid-19 Vaccination Clinic  Name:  Aaron Nash    MRN: [id number] DOB: [birth year]  10/17/2019  [name] was observed post Covid-19 immunization for 15 minutes without incident. He was provided with Vaccine Information Sheet and instruction to access the V-Safe system.   [name] was instructed to call 911 with any severe reactions post vaccine: Marland Kitchen Difficulty breathing  . Swelling of face and throat  . A fast heartbeat  . A bad rash all over body  . Dizziness and weakness   Immunizations Administered    Name Date Dose VIS Date Route   Pfizer COVID-19 Vaccine 10/17/2019  9:56 AM 0.3 mL 07/17/2019 Intramuscular   Manufacturer: [place]   Lot: [id number]   [place]: [id number]

## 2019-10-27 ENCOUNTER — Encounter (INDEPENDENT_AMBULATORY_CARE_PROVIDER_SITE_OTHER): Payer: Self-pay | Admitting: Internal Medicine

## 2019-10-27 ENCOUNTER — Other Ambulatory Visit: Payer: Self-pay

## 2019-10-27 ENCOUNTER — Encounter (INDEPENDENT_AMBULATORY_CARE_PROVIDER_SITE_OTHER): Payer: Self-pay | Admitting: *Deleted

## 2019-10-27 ENCOUNTER — Ambulatory Visit (INDEPENDENT_AMBULATORY_CARE_PROVIDER_SITE_OTHER): Payer: 59 | Admitting: Internal Medicine

## 2019-10-27 DIAGNOSIS — K589 Irritable bowel syndrome without diarrhea: Secondary | ICD-10-CM | POA: Insufficient documentation

## 2019-10-27 DIAGNOSIS — K580 Irritable bowel syndrome with diarrhea: Secondary | ICD-10-CM

## 2019-10-27 DIAGNOSIS — Z85038 Personal history of other malignant neoplasm of large intestine: Secondary | ICD-10-CM

## 2019-10-27 MED ORDER — DICYCLOMINE HCL 10 MG PO CAPS: 10.0000 mg | ORAL_CAPSULE | Freq: Three times a day (TID) | ORAL | 5 refills | Status: DC | PRN

## 2019-10-27 NOTE — Patient Instructions (Signed)
Colonoscopy to be scheduled. 
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
## 2019-10-28 ENCOUNTER — Other Ambulatory Visit (INDEPENDENT_AMBULATORY_CARE_PROVIDER_SITE_OTHER): Payer: Self-pay | Admitting: *Deleted

## 2019-10-29 ENCOUNTER — Other Ambulatory Visit (INDEPENDENT_AMBULATORY_CARE_PROVIDER_SITE_OTHER): Payer: Self-pay | Admitting: *Deleted

## 2019-10-29 DIAGNOSIS — K580 Irritable bowel syndrome with diarrhea: Secondary | ICD-10-CM

## 2019-10-29 DIAGNOSIS — Z85038 Personal history of other malignant neoplasm of large intestine: Secondary | ICD-10-CM

## 2019-11-09 ENCOUNTER — Ambulatory Visit: Payer: 59 | Attending: Internal Medicine

## 2019-11-09 ENCOUNTER — Other Ambulatory Visit: Payer: Self-pay

## 2019-11-09 ENCOUNTER — Ambulatory Visit: Payer: Self-pay

## 2019-11-09 ENCOUNTER — Other Ambulatory Visit (HOSPITAL_COMMUNITY)
Admission: RE | Admit: 2019-11-09 | Discharge: 2019-11-09 | Disposition: A | Discharge status: Home / Self Care | Payer: 59 | Source: Ambulatory Visit | Attending: Internal Medicine | Admitting: Internal Medicine

## 2019-11-09 DIAGNOSIS — Z20822 Contact with and (suspected) exposure to covid-19: Secondary | ICD-10-CM | POA: Insufficient documentation

## 2019-11-09 DIAGNOSIS — Z01812 Encounter for preprocedural laboratory examination: Secondary | ICD-10-CM | POA: Diagnosis present

## 2019-11-09 DIAGNOSIS — Z23 Encounter for immunization: Secondary | ICD-10-CM

## 2019-11-09 LAB — SARS CORONAVIRUS 2 (TAT 6-24 HRS): SARS Coronavirus 2: NEGATIVE

## 2019-11-09 NOTE — Progress Notes (Signed)
   Covid-19 Vaccination Clinic  Name:  Aaron Nash    MRN: [id number] DOB: 1966-07-05  11/09/2019  [name] was observed post Covid-19 immunization for 15 minutes without incident. He was provided with Vaccine Information Sheet and instruction to access the V-Safe system.   [name] was instructed to call 911 with any severe reactions post vaccine: Marland Kitchen Difficulty breathing  . Swelling of face and throat  . A fast heartbeat  . A bad rash all over body  . Dizziness and weakness   Immunizations Administered    Name Date Dose VIS Date Route   Pfizer COVID-19 Vaccine 11/09/2019  4:44 PM 0.3 mL 07/17/2019 Intramuscular   Manufacturer: [place]   Lot: [id number]   [place]: [id number]

## 2019-11-11 ENCOUNTER — Ambulatory Visit (HOSPITAL_COMMUNITY)
Admission: RE | Admit: 2019-11-11 | Discharge: 2019-11-11 | Disposition: A | Discharge status: Home / Self Care | Payer: 59 | Attending: Internal Medicine | Admitting: Internal Medicine

## 2019-11-11 ENCOUNTER — Encounter (HOSPITAL_COMMUNITY)
Admission: RE | Disposition: A | Discharge status: Home / Self Care | Payer: Self-pay | Source: Home / Self Care | Attending: Internal Medicine

## 2019-11-11 ENCOUNTER — Encounter (HOSPITAL_COMMUNITY): Payer: Self-pay | Admitting: Internal Medicine

## 2019-11-11 ENCOUNTER — Other Ambulatory Visit: Payer: Self-pay

## 2019-11-11 DIAGNOSIS — Z85828 Personal history of other malignant neoplasm of skin: Secondary | ICD-10-CM | POA: Insufficient documentation

## 2019-11-11 DIAGNOSIS — Z8719 Personal history of other diseases of the digestive system: Secondary | ICD-10-CM | POA: Insufficient documentation

## 2019-11-11 DIAGNOSIS — K621 Rectal polyp: Secondary | ICD-10-CM

## 2019-11-11 DIAGNOSIS — Z8249 Family history of ischemic heart disease and other diseases of the circulatory system: Secondary | ICD-10-CM | POA: Diagnosis not present

## 2019-11-11 DIAGNOSIS — Z8262 Family history of osteoporosis: Secondary | ICD-10-CM | POA: Diagnosis not present

## 2019-11-11 DIAGNOSIS — D122 Benign neoplasm of ascending colon: Secondary | ICD-10-CM

## 2019-11-11 DIAGNOSIS — F17210 Nicotine dependence, cigarettes, uncomplicated: Secondary | ICD-10-CM | POA: Insufficient documentation

## 2019-11-11 DIAGNOSIS — I10 Essential (primary) hypertension: Secondary | ICD-10-CM | POA: Insufficient documentation

## 2019-11-11 DIAGNOSIS — Z980 Intestinal bypass and anastomosis status: Secondary | ICD-10-CM | POA: Insufficient documentation

## 2019-11-11 DIAGNOSIS — Z85038 Personal history of other malignant neoplasm of large intestine: Secondary | ICD-10-CM

## 2019-11-11 DIAGNOSIS — Z803 Family history of malignant neoplasm of breast: Secondary | ICD-10-CM | POA: Diagnosis not present

## 2019-11-11 DIAGNOSIS — K529 Noninfective gastroenteritis and colitis, unspecified: Secondary | ICD-10-CM | POA: Insufficient documentation

## 2019-11-11 DIAGNOSIS — Z08 Encounter for follow-up examination after completed treatment for malignant neoplasm: Secondary | ICD-10-CM | POA: Diagnosis not present

## 2019-11-11 DIAGNOSIS — D123 Benign neoplasm of transverse colon: Secondary | ICD-10-CM | POA: Insufficient documentation

## 2019-11-11 DIAGNOSIS — K648 Other hemorrhoids: Secondary | ICD-10-CM

## 2019-11-11 DIAGNOSIS — D128 Benign neoplasm of rectum: Secondary | ICD-10-CM | POA: Diagnosis not present

## 2019-11-11 DIAGNOSIS — K580 Irritable bowel syndrome with diarrhea: Secondary | ICD-10-CM

## 2019-11-11 DIAGNOSIS — Z8261 Family history of arthritis: Secondary | ICD-10-CM | POA: Diagnosis not present

## 2019-11-11 DIAGNOSIS — K644 Residual hemorrhoidal skin tags: Secondary | ICD-10-CM | POA: Diagnosis not present

## 2019-11-11 HISTORY — PX: COLONOSCOPY: SHX5424

## 2019-11-11 HISTORY — DX: Malignant (primary) neoplasm, unspecified: C80.1

## 2019-11-11 HISTORY — PX: POLYPECTOMY: SHX5525

## 2019-11-11 SURGERY — COLONOSCOPY
Anesthesia: Moderate Sedation

## 2019-11-11 MED ORDER — STERILE WATER FOR IRRIGATION IR SOLN: Status: DC | PRN

## 2019-11-11 MED ORDER — MIDAZOLAM HCL 5 MG/5ML IJ SOLN
INTRAMUSCULAR | Status: DC | PRN
  Administered 2019-11-11: 2 mg via INTRAVENOUS
  Administered 2019-11-11 (×2): 1 mg via INTRAVENOUS

## 2019-11-11 MED ORDER — MEPERIDINE HCL 50 MG/ML IJ SOLN
INTRAMUSCULAR | Status: AC
  Filled 2019-11-11: qty 1

## 2019-11-11 MED ORDER — SODIUM CHLORIDE 0.9 % IV SOLN
INTRAVENOUS | Status: DC
  Administered 2019-11-11: 1000 mL via INTRAVENOUS

## 2019-11-11 MED ORDER — MEPERIDINE HCL 50 MG/ML IJ SOLN
INTRAMUSCULAR | Status: DC | PRN
  Administered 2019-11-11 (×2): 25 mg via INTRAVENOUS

## 2019-11-11 MED ORDER — SODIUM CHLORIDE 0.9 % IV SOLN
INTRAVENOUS | Status: AC | PRN
  Administered 2019-11-11: 1000 mL via INTRAVENOUS

## 2019-11-11 MED ORDER — MIDAZOLAM HCL 5 MG/5ML IJ SOLN
INTRAMUSCULAR | Status: AC
  Filled 2019-11-11: qty 10

## 2019-11-11 NOTE — Interval H&P Note (Signed)
Patient reports improvement in diarrhea since he has been dicyclomine.  He is not having 2 stools per day.  Stools are soft.  No new symptoms or complaints since he was last seen. History and Physical Interval Note:  11/11/2019 1:46 PM  Aaron Nash  has presented today for surgery, with the diagnosis of diarrhea, history of colon cancer, IBS.  The various methods of treatment have been discussed with the patient and family. After consideration of risks, benefits and other options for treatment, the patient has consented to  Procedure(s) with comments: COLONOSCOPY (N/A) - 145 as a surgical intervention.  The patient's history has been reviewed, patient examined, no change in status, stable for surgery.  I have reviewed the patient's chart and labs.  Questions were answered to the patient's satisfaction.     [employer]

## 2019-11-11 NOTE — Op Note (Addendum)
[facility] Patient Name: Aaron Nash Procedure Date: 11/11/2019 1:30 PM MRN: [id number] Date of Birth: Nov 26, 1965 Attending MD: Hildred Laser , MD CSN: [id number] Age: 54 Admit Type: Outpatient Procedure:                Colonoscopy Indications:              High risk colon cancer surveillance: Personal                            history of colon cancer Providers:                Hildred Laser, MD, Hinton Rao, RN, Raphael Gibney, Technician Referring MD:             Tildon Husky, MD Medicines:                Meperidine 50 mg IV, Midazolam 4 mg IV Complications:            No immediate complications. Estimated Blood Loss:     Estimated blood loss was minimal. Procedure:                Pre-Anesthesia Assessment:                           - Prior to the procedure, a History and Physical                            was performed, and patient medications and                            allergies were reviewed. The patient's tolerance of                            previous anesthesia was also reviewed. The risks                            and benefits of the procedure and the sedation                            options and risks were discussed with the patient.                            All questions were answered, and informed consent                            was obtained. Prior Anticoagulants: The patient has                            taken no previous anticoagulant or antiplatelet                            agents. ASA Grade Assessment: I - A normal, healthy  patient. After reviewing the risks and benefits,                            the patient was deemed in satisfactory condition to                            undergo the procedure.                           After obtaining informed consent, the colonoscope                            was passed under direct vision. Throughout the   procedure, the patient's blood pressure, pulse, and                            oxygen saturations were monitored continuously. The                            [id number]-[id number] scope was introduced through                            the anus and advanced to the the cecum, identified                            by appendiceal orifice and ileocecal valve. The                            colonoscopy was performed without difficulty. The                            patient tolerated the procedure well. The quality                            of the bowel preparation was good. The ileocecal                            valve, appendiceal orifice, and rectum were                            photographed. Scope In: 1:54:10 PM Scope Out: 3:06:38 PM Scope Withdrawal Time: 1 hour 5 minutes 4 seconds  Total Procedure Duration: 1 hour 12 minutes 28 seconds  Findings:      The perianal and digital rectal examinations were normal.      Two sessile polyps were found in the hepatic flexure and ascending       colon. The polyps were small in size. These were biopsied with a cold       forceps for histology. The pathology specimen was placed into Bottle       Number 1.      A small polyp was found in the distal rectum. The polyp was removed with       a cold snare. Resection and retrieval were complete. The pathology       specimen was placed into Bottle Number 1.  There was evidence of a prior end-to-end colo-rectal anastomosis at 12       cm proximal to the anus. This was patent and was characterized by       involvement with polyp.      A large polyp was found in the mid rectum. The polyp was flat and       multi-lobulated involving the anastamosis. Area was successfully       injected with 12 mL Eleview for lesion assessment, but the lesion could       not be lifted adequately. The polyp was removed with a piecemeal       technique using a hot snare. Polyp resection was incomplete. The       resected  tissue was retrieved using a Roth net. One hemostatic clip was       successfully placed (MR conditional). To stop active bleeding, one       hemostatic clip was successfully placed (MR conditional). There was no       bleeding at the end of the procedure. Coagulation for destruction of       remaining portion of lesion using argon plasma was successful.       Polypectomy incomplete.specimen placed in bottle 2.      External hemorrhoids were found during retroflexion. The hemorrhoids       were small. Impression:               - Two small polyps at the hepatic flexure and in                            the ascending colon. Biopsied.                           - One small polyp in the distal rectum, removed                            with a cold snare. Resected and retrieved.                           - Patent end-to-end colo-rectal anastomosis,                            characterized by involvement with polyp.                           - One large polyp in the mid rectum, removed                            piecemeal using a hot snare. Incomplete resection.                            Resected tissue retrieved. Injected. Clip (MR                            conditional) was placed to control bleeding.                            Treated with argon plasma coagulation (APC).                           -  External hemorrhoids. Moderate Sedation:      Moderate (conscious) sedation was administered by the endoscopy nurse       and supervised by the endoscopist. The following parameters were       monitored: oxygen saturation, heart rate, blood pressure, CO2       capnography and response to care. Total physician intraservice time was       77 minutes. Recommendation:           - Patient has a contact number available for                            emergencies. The signs and symptoms of potential                            delayed complications were discussed with the                             patient. Return to normal activities tomorrow.                            Written discharge instructions were provided to the                            patient.                           - Resume previous diet today.                           - Continue present medications.                           - No aspirin, ibuprofen, naproxen, or other                            non-steroidal anti-inflammatory drugs for 10 days.                           - Await pathology results.                           - Repeat colonoscopy is recommended. The                            colonoscopy date will be determined after pathology                            results from today's exam become available for                            review. Procedure Code(s):        --- Professional ---                           [phone], Colonoscopy, flexible; with removal of  tumor(s), polyp(s), or other lesion(s) by snare                            technique                           45380, 59, Colonoscopy, flexible; with biopsy,                            single or multiple                           45381, Colonoscopy, flexible; with directed                            submucosal injection(s), any substance                           99153, Moderate sedation; each additional 15                            minutes intraservice time                           99153, Moderate sedation; each additional 15                            minutes intraservice time                           99153, Moderate sedation; each additional 15                            minutes intraservice time                           99153, Moderate sedation; each additional 15                            minutes intraservice time                           G0500, Moderate sedation services provided by the                            same physician or other qualified health care                            professional performing a  gastrointestinal                            endoscopic service that sedation supports,                            requiring the presence of an independent trained                            observer  to assist in the monitoring of the                            patient's level of consciousness and physiological                            status; initial 15 minutes of intra-service time;                            patient age 110 years or older (additional time may                            be reported with [phone], as appropriate) Diagnosis Code(s):        --- Professional ---                           [phone], Personal history of other malignant                            neoplasm of large intestine                           K63.5, Polyp of colon                           K62.1, Rectal polyp                           K64.4, Residual hemorrhoidal skin tags CPT copyright 2019 American Medical Association. All rights reserved. The codes documented in this report are preliminary and upon coder review may  be revised to meet current compliance requirements. Hildred Laser, MD Hildred Laser, MD 11/11/2019 3:27:45 PM This report has been signed electronically. Number of Addenda: 0

## 2019-11-11 NOTE — Discharge Instructions (Signed)
No aspirin or NSAIDs for 10 days. Resume usual medications and diet as before. No driving for 24 hours. Physician will call with biopsy results. Remember you cannot have an MRI until clip has passed.   Colonoscopy, Adult, Care After This sheet gives you information about how to care for yourself after your procedure. Your health care provider may also give you more specific instructions. If you have problems or questions, contact your health care provider, Dr. Laural Golden:  [phone]  What can I expect after the procedure? After the procedure, it is common to have:  A small amount of blood in your stool for 24 hours after the procedure.  Some gas.  Mild cramping or bloating of your abdomen. Follow these instructions at home: Eating and drinking   Drink enough fluid to keep your urine pale yellow.  Resume your normal diet as instructed by your health care provider.    Activity  Rest as told by your health care provider.  Managing cramping and bloating   Try walking around when you have cramps or feel bloated.   For the first 24 hours after the procedure: ? Do not drive or use machinery. ? Do not sign important documents. ? Do not drink alcohol. ? Do your regular daily activities at a slower pace than normal. ? Eat soft foods that are easy to digest.  Take over-the-counter and prescription medicines only as told by your health care provider.  Keep all follow-up visits as told by your health care provider. This is important. Contact a health care provider if:  You have blood in your stool 2-3 days after the procedure. Get help right away if you have:  More than a small spotting of blood in your stool.  Large blood clots in your stool.  Swelling of your abdomen.  Nausea or vomiting.  A fever.  Increasing pain in your abdomen that is not relieved with medicine. Summary  After the procedure, it is common to have a small amount of blood in your stool. You may  also have mild cramping and bloating of your abdomen.  For the first 24 hours after the procedure, do not drive or use machinery, sign important documents, or drink alcohol.  Get help right away if you have a lot of blood in your stool, nausea or vomiting, a fever, or increased pain in your abdomen. This information is not intended to replace advice given to you by your health care provider. Make sure you discuss any questions you have with your health care provider. Document Revised: 02/16/2019 Document Reviewed: 02/16/2019 Elsevier Patient Education  [place].

## 2019-11-13 LAB — SURGICAL PATHOLOGY

## 2019-12-15 ENCOUNTER — Ambulatory Visit: Payer: 59 | Admitting: Dermatology

## 2019-12-17 ENCOUNTER — Ambulatory Visit: Payer: 59 | Admitting: Dermatology

## 2019-12-30 ENCOUNTER — Ambulatory Visit: Payer: 59 | Admitting: Dermatology

## 2019-12-30 ENCOUNTER — Ambulatory Visit (INDEPENDENT_AMBULATORY_CARE_PROVIDER_SITE_OTHER): Payer: Self-pay | Admitting: Gastroenterology

## 2020-01-12 ENCOUNTER — Ambulatory Visit (INDEPENDENT_AMBULATORY_CARE_PROVIDER_SITE_OTHER): Payer: 59 | Admitting: Dermatology

## 2020-01-12 ENCOUNTER — Encounter: Payer: Self-pay | Admitting: Dermatology

## 2020-01-12 ENCOUNTER — Other Ambulatory Visit: Payer: Self-pay

## 2020-01-12 DIAGNOSIS — D225 Melanocytic nevi of trunk: Secondary | ICD-10-CM | POA: Diagnosis not present

## 2020-01-12 DIAGNOSIS — D229 Melanocytic nevi, unspecified: Secondary | ICD-10-CM

## 2020-01-12 DIAGNOSIS — C44629 Squamous cell carcinoma of skin of left upper limb, including shoulder: Secondary | ICD-10-CM | POA: Diagnosis not present

## 2020-01-12 DIAGNOSIS — D290 Benign neoplasm of penis: Secondary | ICD-10-CM | POA: Diagnosis not present

## 2020-01-12 DIAGNOSIS — D485 Neoplasm of uncertain behavior of skin: Secondary | ICD-10-CM

## 2020-01-12 DIAGNOSIS — C4492 Squamous cell carcinoma of skin, unspecified: Secondary | ICD-10-CM

## 2020-01-12 HISTORY — DX: Squamous cell carcinoma of skin, unspecified: C44.92

## 2020-01-12 NOTE — Patient Instructions (Signed)

## 2020-01-15 NOTE — Progress Notes (Signed)
   Follow-Up Visit   Subjective  Aaron Nash is a 54 y.o. male who presents for the following: Skin Problem (Patient here today for spot penis x years. No bleeding, no itching, patient does have multiple partners.).  Growth Location: Forearm Duration:  Quality:  Associated Signs/Symptoms: Modifying Factors:  Severity:  Timing: Context:   The following portions of the chart were reviewed this encounter and updated as appropriate: Tobacco  Allergies  Meds  Problems  Med Hx  Surg Hx  Fam Hx      Objective  Well appearing patient in no apparent distress; mood and affect are within normal limits.  A full examination was performed including scalp, head, eyes, ears, nose, lips, neck, chest, axillae, abdomen, back, buttocks, bilateral upper extremities, bilateral lower extremities, hands, feet, fingers, toes, fingernails, and toenails. All findings within normal limits unless otherwise noted below.   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  Destruction of lesion Complexity: simple   Destruction method: electrodesiccation and curettage   Informed consent: discussed and consent obtained   Timeout:  patient name, date of birth, surgical site, and procedure verified Anesthesia: the lesion was anesthetized in a standard fashion   Anesthetic:  1% lidocaine w/ epinephrine 1-100,000 local infiltration Curettage performed in three different directions: Yes   Curettage cycles:  3 Lesion length (cm):  0.8 Lesion width (cm):  0.8 Margin per side (cm):  0 Final wound size (cm):  0.8 Hemostasis achieved with:  ferric subsulfate Outcome: patient tolerated procedure well with no complications   Post-procedure details: wound care  instructions given   Additional details:  After initial shave biopsy deep keratin extension noted at the base curetted and cauterized.  Nevus (2) Mid Back; Dorsal Penile Shaft  Annual skin exam Reexamine lesion penis on follow-up in 1 to 2 months

## 2020-01-18 ENCOUNTER — Telehealth: Payer: Self-pay

## 2020-01-18 NOTE — Telephone Encounter (Signed)
-----   Message from Lavonna Monarch, MD sent at 01/15/2020  6:18 AM EDT ----- Schedule surgery with Dr. Darene Lamer

## 2020-01-18 NOTE — Telephone Encounter (Signed)
Phone call to patient with his pathology results. Patient aware of results.  

## 2020-01-25 ENCOUNTER — Encounter: Payer: Self-pay | Admitting: Dermatology

## 2020-01-27 ENCOUNTER — Encounter (INDEPENDENT_AMBULATORY_CARE_PROVIDER_SITE_OTHER): Payer: Self-pay | Admitting: *Deleted

## 2020-01-29 ENCOUNTER — Telehealth: Payer: Self-pay | Admitting: *Deleted

## 2020-01-29 NOTE — Telephone Encounter (Signed)
Left patient a message to call us back. Dr. Denna Haggard wanted to re examine in 1 month.

## 2020-02-22 ENCOUNTER — Other Ambulatory Visit: Payer: Self-pay

## 2020-02-22 ENCOUNTER — Encounter: Payer: Self-pay | Admitting: Dermatology

## 2020-02-22 ENCOUNTER — Ambulatory Visit (INDEPENDENT_AMBULATORY_CARE_PROVIDER_SITE_OTHER): Payer: 59 | Admitting: Dermatology

## 2020-02-22 DIAGNOSIS — A630 Anogenital (venereal) warts: Secondary | ICD-10-CM

## 2020-02-22 DIAGNOSIS — C44629 Squamous cell carcinoma of skin of left upper limb, including shoulder: Secondary | ICD-10-CM | POA: Diagnosis not present

## 2020-02-22 DIAGNOSIS — C4492 Squamous cell carcinoma of skin, unspecified: Secondary | ICD-10-CM

## 2020-02-22 NOTE — Patient Instructions (Addendum)
Biopsy, Surgery (Curettage) & Surgery (Excision) Aftercare Instructions  1. Okay to remove bandage in 24 hours  2. Wash area with soap and water  3. Apply Vaseline to area twice daily until healed (Not Neosporin)  4. Okay to cover with a Band-Aid to decrease the chance of infection or prevent irritation from clothing; also it's okay to uncover lesion at home.  5. Suture instructions: return to our office in 7-10 or 10-14 days for a nurse visit for suture removal. Variable healing with sutures, if pain or itching occurs call our office. It's okay to shower or bathe 24 hours after sutures are given.  6. The following risks may occur after a biopsy, curettage or excision: bleeding, scarring, discoloration, recurrence, infection (redness, yellow drainage, pain or swelling).  7. For questions, concerns and results call our office at [place] before 4pm & Friday before 3pm. Biopsy results will be available in 1 week. 2 issues discussed today with Aaron Nash [facility]) date of birth 07-05-1966.  First was a biopsy-proven low risk nonmole cancer (SCCA/KA) on the left forearm.  There appears to be little residual after the biopsy; the base was curetted, cauterized, recuretted and parenteral fluorouracil applied.  There will be a small abrasion there for 1 to 2 weeks.  He should avoid a sunburn on the area.  If this were to recur it usually does so in the first couple of months.  If it is smooth in 2 months and the other problem resolved, routine follow-up for general skin check in 1 year.  The second issue relates to a small growth on the left penis just above the base.  Aaron Nash was told this could be either a keratosis or wart.  He desired to have it removed (he has a history of several partners).  After local numbing the lesion was gently scraped and the base cauterized.  There will be a small abrasion there and he should use common sense in his care.  We will discuss the results of this sample  if he would call the office in 2 to 3 days.

## 2020-02-25 ENCOUNTER — Telehealth: Payer: Self-pay | Admitting: Dermatology

## 2020-02-25 NOTE — Telephone Encounter (Signed)
Patient left message on office voice mail wanting pathology results from last visit.  (Chart # [id number])

## 2020-02-26 ENCOUNTER — Telehealth: Payer: Self-pay | Admitting: *Deleted

## 2020-02-26 NOTE — Telephone Encounter (Signed)
No answer but patients pathology report is not back yet.

## 2020-02-29 NOTE — Telephone Encounter (Signed)
Patient informed of results and recommendations

## 2020-02-29 NOTE — Telephone Encounter (Signed)
-----   Message from Lavonna Monarch, MD sent at 02/26/2020 10:30 PM EDT ----- Biopsy does suggest that this is a viral wart, so he may see some new bumps in the future.  We will check this area at the same time as his scalp in 3 to 4 months.

## 2020-03-06 DIAGNOSIS — U071 COVID-19: Secondary | ICD-10-CM

## 2020-03-06 HISTORY — DX: COVID-19: U07.1

## 2020-03-08 ENCOUNTER — Other Ambulatory Visit (INDEPENDENT_AMBULATORY_CARE_PROVIDER_SITE_OTHER): Payer: Self-pay | Admitting: *Deleted

## 2020-03-08 ENCOUNTER — Encounter (INDEPENDENT_AMBULATORY_CARE_PROVIDER_SITE_OTHER): Payer: Self-pay | Admitting: *Deleted

## 2020-03-08 DIAGNOSIS — Z86010 Personal history of colonic polyps: Secondary | ICD-10-CM

## 2020-03-09 ENCOUNTER — Other Ambulatory Visit (INDEPENDENT_AMBULATORY_CARE_PROVIDER_SITE_OTHER): Payer: Self-pay | Admitting: *Deleted

## 2020-03-20 ENCOUNTER — Encounter: Payer: Self-pay | Admitting: Dermatology

## 2020-03-20 NOTE — Progress Notes (Signed)
   Follow-Up Visit   Subjective  Aaron Nash is a 54 y.o. male who presents for the following: Procedure (squamos cell carcinoma left forearm).  SCCA Location: Forearm Duration:  Quality:  Associated Signs/Symptoms: Modifying Factors:  Severity:  Timing: Context: Also notes enlarging growth on penis.  Objective  Well appearing patient in no apparent distress; mood and affect are within normal limits.  A focused examination was performed including Head, neck, arms, genitalia.. Relevant physical exam findings are noted in the Assessment and Plan.   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  Sterile petrolatum dressing.   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  Destruction of lesion Complexity: simple   Destruction method: electrodesiccation and curettage   Informed consent: discussed and consent obtained   Timeout:  patient name, date of birth, surgical site, and procedure verified Anesthesia: the lesion was anesthetized in a standard fashion   Anesthetic:  1% lidocaine w/ epinephrine 1-100,000 local infiltration Curettage performed in three different directions: Yes   Curettage cycles:  3 Lesion length (cm):  1.2 Lesion width (cm):  1 Margin per side (cm):  0 Final wound size (cm):  1.2 Hemostasis achieved with:  ferric subsulfate Outcome: patient tolerated procedure well with no complications   Post-procedure details: wound care instructions given   Additional details:  Inoculated with parenteral 5% fluorouracil  2 issues discussed today with Cl[facility]ust[facility]) date of birth [birth year].  First was a biopsy-proven low risk nonmole  cancer (SCCA/KA) on the left forearm.  There appears to be little residual after the biopsy; the base was curetted, cauterized, recuretted and parenteral fluorouracil applied.  There will be a small abrasion there for 1 to 2 weeks.  He should avoid a sunburn on the area.  If this were to recur it usually does so in the first couple of months.  If it is smooth in 2 months and the other problem resolved, routine follow-up for general skin check in 1 year.  The second issue relates to a small growth on the left penis just above the base.  [name] was told this could be either a keratosis or wart.  He desired to have it removed (he has a history of several partners).  After local numbing the lesion was gently scraped and the base cauterized.  There will be a small abrasion there and he should use common sense in his care.  We will discuss the results of this sample if he would call the office in 2 to 3 days.   I, Lavonna Monarch, MD, have reviewed all documentation for this visit.  The documentation on 03/20/20 for the exam, diagnosis, procedures, and orders are all accurate and complete.

## 2020-03-29 ENCOUNTER — Other Ambulatory Visit (HOSPITAL_COMMUNITY)
Admission: RE | Admit: 2020-03-29 | Discharge: 2020-03-29 | Disposition: A | Discharge status: Home / Self Care | Payer: 59 | Source: Ambulatory Visit | Attending: Internal Medicine | Admitting: Internal Medicine

## 2020-03-29 ENCOUNTER — Other Ambulatory Visit: Payer: Self-pay

## 2020-03-29 DIAGNOSIS — Z20822 Contact with and (suspected) exposure to covid-19: Secondary | ICD-10-CM | POA: Diagnosis not present

## 2020-03-29 DIAGNOSIS — Z01812 Encounter for preprocedural laboratory examination: Secondary | ICD-10-CM | POA: Insufficient documentation

## 2020-03-30 LAB — SARS CORONAVIRUS 2 (TAT 6-24 HRS): SARS Coronavirus 2: NEGATIVE

## 2020-03-31 ENCOUNTER — Encounter (HOSPITAL_COMMUNITY): Payer: Self-pay | Admitting: Internal Medicine

## 2020-03-31 ENCOUNTER — Encounter (HOSPITAL_COMMUNITY)
Admission: RE | Disposition: A | Discharge status: Home / Self Care | Payer: Self-pay | Source: Home / Self Care | Attending: Internal Medicine

## 2020-03-31 ENCOUNTER — Other Ambulatory Visit (INDEPENDENT_AMBULATORY_CARE_PROVIDER_SITE_OTHER): Payer: Self-pay | Admitting: *Deleted

## 2020-03-31 ENCOUNTER — Encounter (INDEPENDENT_AMBULATORY_CARE_PROVIDER_SITE_OTHER): Payer: Self-pay | Admitting: *Deleted

## 2020-03-31 ENCOUNTER — Ambulatory Visit (HOSPITAL_COMMUNITY)
Admission: RE | Admit: 2020-03-31 | Discharge: 2020-03-31 | Disposition: A | Discharge status: Home / Self Care | Payer: 59 | Attending: Internal Medicine | Admitting: Internal Medicine

## 2020-03-31 DIAGNOSIS — Z79899 Other long term (current) drug therapy: Secondary | ICD-10-CM | POA: Insufficient documentation

## 2020-03-31 DIAGNOSIS — Z1211 Encounter for screening for malignant neoplasm of colon: Secondary | ICD-10-CM | POA: Insufficient documentation

## 2020-03-31 DIAGNOSIS — Z86010 Personal history of colonic polyps: Secondary | ICD-10-CM | POA: Insufficient documentation

## 2020-03-31 DIAGNOSIS — Z87891 Personal history of nicotine dependence: Secondary | ICD-10-CM | POA: Diagnosis not present

## 2020-03-31 DIAGNOSIS — Z980 Intestinal bypass and anastomosis status: Secondary | ICD-10-CM | POA: Insufficient documentation

## 2020-03-31 DIAGNOSIS — Z8262 Family history of osteoporosis: Secondary | ICD-10-CM | POA: Insufficient documentation

## 2020-03-31 DIAGNOSIS — Z8249 Family history of ischemic heart disease and other diseases of the circulatory system: Secondary | ICD-10-CM | POA: Insufficient documentation

## 2020-03-31 DIAGNOSIS — K621 Rectal polyp: Secondary | ICD-10-CM | POA: Diagnosis not present

## 2020-03-31 DIAGNOSIS — Z09 Encounter for follow-up examination after completed treatment for conditions other than malignant neoplasm: Secondary | ICD-10-CM | POA: Diagnosis not present

## 2020-03-31 DIAGNOSIS — Z85828 Personal history of other malignant neoplasm of skin: Secondary | ICD-10-CM | POA: Diagnosis not present

## 2020-03-31 DIAGNOSIS — Z85038 Personal history of other malignant neoplasm of large intestine: Secondary | ICD-10-CM | POA: Insufficient documentation

## 2020-03-31 DIAGNOSIS — Z9119 Patient's noncompliance with other medical treatment and regimen: Secondary | ICD-10-CM

## 2020-03-31 SURGERY — SIGMOIDOSCOPY, FLEXIBLE
Anesthesia: Moderate Sedation

## 2020-03-31 MED ORDER — MEPERIDINE HCL 25 MG/ML IJ SOLN
INTRAMUSCULAR | Status: DC | PRN
Start: 2020-03-31 — End: 2020-03-31
  Administered 2020-03-31: 25 mg via INTRAVENOUS

## 2020-03-31 MED ORDER — SODIUM CHLORIDE 0.9 % IV SOLN: INTRAVENOUS | Status: DC

## 2020-03-31 MED ORDER — MEPERIDINE HCL 50 MG/ML IJ SOLN
INTRAMUSCULAR | Status: AC
  Filled 2020-03-31: qty 1

## 2020-03-31 MED ORDER — MIDAZOLAM HCL 5 MG/5ML IJ SOLN
INTRAMUSCULAR | Status: DC | PRN
  Administered 2020-03-31 (×2): 2 mg via INTRAVENOUS

## 2020-03-31 MED ORDER — MIDAZOLAM HCL 5 MG/5ML IJ SOLN
INTRAMUSCULAR | Status: AC
  Filled 2020-03-31: qty 10

## 2020-03-31 NOTE — Discharge Instructions (Signed)
Incomplete Exam PatientPATIENT INSTRUCTIONS POST-ANESTHESIA  IMMEDIATELY FOLLOWING SURGERY:  Do not drive or operate machinery for the first twenty four hours after surgery.  Do not make any important decisions for twenty four hours after surgery or while taking narcotic pain medications or sedatives.  If you develop intractable nausea and vomiting or a severe headache please notify your doctor immediately.  FOLLOW-UP:  Please make an appointment with your surgeon as instructed. You do not need to follow up with anesthesia unless specifically instructed to do so.  WOUND CARE INSTRUCTIONS (if applicable):  Keep a dry clean dressing on the anesthesia/puncture wound site if there is drainage.  Once the wound has quit draining you may leave it open to air.  Generally you should leave the bandage intact for twenty four hours unless there is drainage.  If the epidural site drains for more than 36-48 hours please call the anesthesia department.  QUESTIONS?:  Please feel free to call your physician or the hospital operator if you have any questions, and they will be happy to assist you.       will go by Dr Olevia Perches office and pick up instructions for next procedure

## 2020-03-31 NOTE — H&P (Signed)
Aaron Nash is an 54 y.o. male.   Chief Complaint: Patient is here for flexible sigmoidoscopy.  HPI: Patient is 54 year old Caucasian male who underwent low anterior resection in November 2019 for large tubulovillous adenoma with intramucosal carcinoma at [facility].  He underwent surveillance colonoscopy by me in April 2021 when he was found to have a large multilobulated polyp just distal to anastomosis.  This polyp could not be raised.  This was removed piecemeal.  Polyp was tubulovillous adenoma with focal high-grade dysplasia but no invasive carcinoma.  He is returning to reexamine the site and remove any residual polyp.  He denies abdominal pain rectal bleeding and his diarrhea is well controlled with single dose of dicyclomine. Family history is negative for CRC.  Past Medical History:  Diagnosis Date  . Atypical mole 01/08/2019   mild atypia on left upper back (free)  . Atypical mole 01/08/2019   mild atypia on lower right back (free)  . Basal cell carcinoma 12/12/2017   pigmented on right forehead [facility])  . Cancer ([place])    skin cancer, basal cell  . Chronic diarrhea   . Colon cancer ([place]) 2019  . History of colonic polyps 06/2018   Patient states that there was numerous polyps and told one had a cancer seed in it.  Marland Kitchen SCCA (squamous cell carcinoma) of skin 01/12/2020   Left Forearm-Posterior (Keratoacanthoma) currette, cautery, 43fu    Past Surgical History:  Procedure Laterality Date  . COLON SURGERY    . COLONOSCOPY  06/2018   Dr.Cathey in [place]  . COLONOSCOPY N/A 11/11/2019   Procedure: COLONOSCOPY;  Surgeon: Rogene Houston, MD;  Location: [facility];  Service: Endoscopy;  Laterality: N/A;  145  . FRACTURE SURGERY Left    leg - injury as a child  . POLYPECTOMY  11/11/2019   Procedure: POLYPECTOMY;  Surgeon: Rogene Houston, MD;  Location: [facility];  Service: Endoscopy;;  Hepatic and ascending colon polyp biopsy forcep,  rectal polyp distal to anastamosis, large  rectal polyp (piecemeal) hot snare.    Family History  Problem Relation Age of Onset  . Osteoporosis Mother   . Hypertension Father   . Healthy Sister    Social History:  reports that he quit smoking about 5 years ago. His smoking use included cigarettes. He has never used smokeless tobacco. He reports current alcohol use. He reports that he does not use drugs.  Allergies: No Known Allergies  Medications Prior to Admission  Medication Sig Dispense Refill  . rosuvastatin (CRESTOR) 5 MG tablet Take 5 mg by mouth daily.    . sildenafil (VIAGRA) 100 MG tablet Take 100 mg by mouth as needed for erectile dysfunction.     . dicyclomine (BENTYL) 10 MG capsule Take 1 capsule (10 mg total) by mouth 3 (three) times daily as needed for spasms. (Patient not taking: Reported on 03/25/2020) 90 capsule 5    Results for orders placed or performed during the hospital encounter of 03/29/20 (from the past 48 hour(s))  SARS CORONAVIRUS 2 (TAT 6-24 HRS) Nasopharyngeal Nasopharyngeal Swab     Status: None   Collection Time: 03/29/20  3:00 PM   Specimen: Nasopharyngeal Swab  Result Value Ref Range   SARS Coronavirus 2 NEGATIVE NEGATIVE    Comment: (NOTE) SARS-CoV-2 target nucleic acids are NOT DETECTED.  The SARS-CoV-2 RNA is generally detectable in upper and lower respiratory specimens during the acute phase of infection. Negative results do not preclude SARS-CoV-2 infection, do not  rule out co-infections with other pathogens, and should not be used as the sole basis for treatment or other patient management decisions. Negative results must be combined with clinical observations, patient history, and epidemiological information. The expected result is Negative.  Fact Sheet for Patients: SugarRoll.be  Fact Sheet for Healthcare Providers: https://www.woods-mathews.com/  This test is not yet approved or cleared by the [place] FDA and  has been  authorized for detection and/or diagnosis of SARS-CoV-2 by FDA under an Emergency Use Authorization (EUA). This EUA will remain  in effect (meaning this test can be used) for the duration of the COVID-19 declaration under Se ction 564(b)(1) of the Act, 21 U.S.C. section 360bbb-3(b)(1), unless the authorization is terminated or revoked sooner.  Performed at [facility], [place] [address], [place], [place] [address]    No results found.  Review of Systems  Blood pressure 97/65, pulse 66, temperature 97.9 F (36.6 C), temperature source Oral, resp. rate 18, height 5\' 9"  (1.753 m), weight 74.8 kg, SpO2 95 %. Physical Exam HENT:     Mouth/Throat:     Mouth: Mucous membranes are moist.     Pharynx: Oropharynx is clear.  Eyes:     General: No scleral icterus.    Conjunctiva/sclera: Conjunctivae normal.  Cardiovascular:     Rate and Rhythm: Normal rate and regular rhythm.     Heart sounds: Normal heart sounds. No murmur heard.   Pulmonary:     Effort: Pulmonary effort is normal.     Breath sounds: Normal breath sounds.  Abdominal:     Comments: Abdomen is symmetrical with lower midline scar.  On palpation is soft and nontender without organomegaly or masses.  Musculoskeletal:        General: No swelling.     Cervical back: Neck supple.  Lymphadenopathy:     Cervical: No cervical adenopathy.  Skin:    General: Skin is warm and dry.  Neurological:     Mental Status: He is alert.      Assessment/Plan History of high-grade rectal adenoma. Flexible sigmoidoscopy to remove residual polyp if any.  Hildred Laser, MD 03/31/2020, 7:36 AM

## 2020-03-31 NOTE — Op Note (Signed)
[facility] Patient Name: Aaron Nash Procedure Date: 03/31/2020 7:06 AM MRN: [id number] Date of Birth: 07-16-1966 Attending MD: Hildred Laser , MD CSN: [id number] Age: 54 Admit Type: Outpatient Procedure:                Flexible Sigmoidoscopy Indications:              High risk colon cancer surveillance: Personal                            history of colonic polyps; history of large adenoma                            distal to rectal anastamosis. Providers:                Hildred Laser, MD, Jeanann Lewandowsky. Sharon Seller, RN, Caprice Kluver, Raphael Gibney, Technician Referring MD:             Arsenio Katz, NP Medicines:                Meperidine 25 mg IV, Midazolam 4 mg IV Complications:            No immediate complications. Estimated Blood Loss:     Estimated blood loss: none. Procedure:                Pre-Anesthesia Assessment:                           - Prior to the procedure, a History and Physical                            was performed, and patient medications and                            allergies were reviewed. The patient's tolerance of                            previous anesthesia was also reviewed. The risks                            and benefits of the procedure and the sedation                            options and risks were discussed with the patient.                            All questions were answered, and informed consent                            was obtained. Prior Anticoagulants: The patient has                            taken no previous anticoagulant or antiplatelet  agents. ASA Grade Assessment: II - A patient with                            mild systemic disease. After reviewing the risks                            and benefits, the patient was deemed in                            satisfactory condition to undergo the procedure.                           After obtaining informed consent, the scope was                             passed under direct vision. The PCF-H190DL                            ([id number]) scope was introduced through the anus and                            advanced to the the sigmoid colon. The flexible                            sigmoidoscopy was accomplished without difficulty.                            The patient tolerated the procedure well. The                            quality of the bowel preparation was poor. Scope In: 7:45:24 AM Scope Out: 4:96:75 AM Total Procedure Duration: 0 hours 3 minutes 52 seconds  Findings:      The perianal and digital rectal examinations were normal.      The distal sigmoid colon appeared normal.      A large polyp was found in the rectum. The polyp was flat and       multi-lobulated. Impression:               - Preparation of the colon was poor.                           - The distal sigmoid colon is normal.                           - One large polyp in the rectum. Polyp not removed                            because of poor prep.                           - No specimens collected. Moderate Sedation:      Moderate (conscious) sedation was administered by the endoscopy nurse       and supervised by the endoscopist. The following parameters were  monitored: oxygen saturation, heart rate, blood pressure, CO2       capnography and response to care. Total physician intraservice time was       8 minutes. Recommendation:           - Discharge patient to home (with escort).                           - Resume previous diet today.                           - Continue present medications.                           - Repeat flexible sigmoidoscopy either later today                            after another prep or another day.. Procedure Code(s):        --- Professional ---                           [phone], Sigmoidoscopy, flexible; diagnostic,                            including collection of specimen(s) by brushing or                             washing, when performed (separate procedure) Diagnosis Code(s):        --- Professional ---                           Z86.010, Personal history of colonic polyps                           K62.1, Rectal polyp CPT copyright 2019 American Medical Association. All rights reserved. The codes documented in this report are preliminary and upon coder review may  be revised to meet current compliance requirements. Hildred Laser, MD Hildred Laser, MD 03/31/2020 7:59:26 AM This report has been signed electronically. Number of Addenda: 0

## 2020-04-04 ENCOUNTER — Other Ambulatory Visit (HOSPITAL_COMMUNITY): Payer: Self-pay

## 2020-04-04 ENCOUNTER — Encounter (HOSPITAL_COMMUNITY): Payer: Self-pay | Admitting: Internal Medicine

## 2020-04-05 ENCOUNTER — Encounter (HOSPITAL_COMMUNITY)
Admit: 2020-04-05 | Discharge: 2020-04-05 | Disposition: A | Discharge status: Home / Self Care | Payer: 59 | Attending: Internal Medicine | Admitting: Internal Medicine

## 2020-04-05 ENCOUNTER — Other Ambulatory Visit (HOSPITAL_COMMUNITY): Payer: Self-pay | Admitting: *Deleted

## 2020-04-05 ENCOUNTER — Other Ambulatory Visit: Payer: Self-pay

## 2020-04-05 DIAGNOSIS — U071 COVID-19: Secondary | ICD-10-CM | POA: Diagnosis not present

## 2020-04-05 DIAGNOSIS — Z01812 Encounter for preprocedural laboratory examination: Secondary | ICD-10-CM | POA: Diagnosis present

## 2020-04-06 LAB — SARS CORONAVIRUS 2 (TAT 6-24 HRS): SARS Coronavirus 2: POSITIVE — AB

## 2020-04-18 ENCOUNTER — Encounter (INDEPENDENT_AMBULATORY_CARE_PROVIDER_SITE_OTHER): Payer: Self-pay | Admitting: *Deleted

## 2020-06-09 ENCOUNTER — Other Ambulatory Visit: Payer: Self-pay

## 2020-06-09 ENCOUNTER — Encounter (HOSPITAL_COMMUNITY)
Admission: RE | Disposition: A | Discharge status: Home / Self Care | Payer: Self-pay | Source: Home / Self Care | Attending: Internal Medicine

## 2020-06-09 ENCOUNTER — Encounter (HOSPITAL_COMMUNITY): Payer: Self-pay | Admitting: Internal Medicine

## 2020-06-09 ENCOUNTER — Ambulatory Visit (HOSPITAL_COMMUNITY)
Admission: RE | Admit: 2020-06-09 | Discharge: 2020-06-09 | Disposition: A | Discharge status: Home / Self Care | Payer: 59 | Attending: Internal Medicine | Admitting: Internal Medicine

## 2020-06-09 DIAGNOSIS — D128 Benign neoplasm of rectum: Secondary | ICD-10-CM | POA: Diagnosis not present

## 2020-06-09 DIAGNOSIS — Z86010 Personal history of colonic polyps: Secondary | ICD-10-CM | POA: Diagnosis not present

## 2020-06-09 DIAGNOSIS — Z09 Encounter for follow-up examination after completed treatment for conditions other than malignant neoplasm: Secondary | ICD-10-CM | POA: Diagnosis not present

## 2020-06-09 DIAGNOSIS — Z85038 Personal history of other malignant neoplasm of large intestine: Secondary | ICD-10-CM

## 2020-06-09 DIAGNOSIS — Z87891 Personal history of nicotine dependence: Secondary | ICD-10-CM | POA: Insufficient documentation

## 2020-06-09 DIAGNOSIS — K621 Rectal polyp: Secondary | ICD-10-CM | POA: Diagnosis not present

## 2020-06-09 HISTORY — PX: POLYPECTOMY: SHX5525

## 2020-06-09 HISTORY — PX: FLEXIBLE SIGMOIDOSCOPY: SHX5431

## 2020-06-09 SURGERY — SIGMOIDOSCOPY, FLEXIBLE
Anesthesia: Moderate Sedation

## 2020-06-09 MED ORDER — MIDAZOLAM HCL 5 MG/5ML IJ SOLN
INTRAMUSCULAR | Status: DC | PRN
  Administered 2020-06-09 (×3): 2 mg via INTRAVENOUS
  Administered 2020-06-09: 1 mg via INTRAVENOUS

## 2020-06-09 MED ORDER — SODIUM CHLORIDE 0.9 % IV SOLN: INTRAVENOUS | Status: DC

## 2020-06-09 MED ORDER — MEPERIDINE HCL 25 MG/ML IJ SOLN
INTRAMUSCULAR | Status: DC | PRN
  Administered 2020-06-09 (×2): 25 mg via INTRAVENOUS

## 2020-06-09 MED ORDER — MIDAZOLAM HCL 5 MG/5ML IJ SOLN
INTRAMUSCULAR | Status: AC
  Filled 2020-06-09: qty 10

## 2020-06-09 MED ORDER — MEPERIDINE HCL 50 MG/ML IJ SOLN
INTRAMUSCULAR | Status: AC
  Filled 2020-06-09: qty 1

## 2020-06-09 MED ORDER — STERILE WATER FOR IRRIGATION IR SOLN
Status: DC | PRN
  Administered 2020-06-09: 100 mL

## 2020-06-09 NOTE — Op Note (Signed)
[facility] Patient Name: Aaron Nash Procedure Date: 06/09/2020 11:47 AM MRN: [id number] Date of Birth: Oct 02, 1965 Attending MD: Hildred Laser , MD CSN: [id number] Age: 54 Admit Type: Outpatient Procedure:                Flexible Sigmoidoscopy Indications:              High risk colon cancer surveillance: Personal                            history of colonic polyps Providers:                Hildred Laser, MD, Lambert Mody, Janeece Riggers,                            RN, Raphael Gibney, Technician Referring MD:             Virl Son Hairfield, NP Medicines:                Meperidine 50 mg IV, Midazolam 7 mg IV Complications:            No immediate complications. Estimated Blood Loss:     Estimated blood loss was minimal. Procedure:                Pre-Anesthesia Assessment:                           - Prior to the procedure, a History and Physical                            was performed, and patient medications and                            allergies were reviewed. The patient's tolerance of                            previous anesthesia was also reviewed. The risks                            and benefits of the procedure and the sedation                            options and risks were discussed with the patient.                            All questions were answered, and informed consent                            was obtained. Prior Anticoagulants: The patient has                            taken no previous anticoagulant or antiplatelet                            agents. ASA Grade Assessment: II - A patient with  mild systemic disease. After reviewing the risks                            and benefits, the patient was deemed in                            satisfactory condition to undergo the procedure.                           After obtaining informed consent, the scope was                            passed under direct vision. The  PCF-H190DL                            ([id number]) scope was introduced through the anus and                            advanced to the the sigmoid colon. The flexible                            sigmoidoscopy was accomplished without difficulty.                            The patient tolerated the procedure well. The                            quality of the bowel preparation was adequate. Scope In: 12:40:24 PM Scope Out: 1:26:54 PM Total Procedure Duration: 0 hours 46 minutes 30 seconds  Findings:      The perianal and digital rectal examinations were normal.      The distal sigmoid colon appeared normal.      A large polyp was found in the mid rectum. The polyp was flat and       multi-lobulated. The polyp was removed with a hot snare and The polyp       was removed with a piecemeal technique using a hot snare. Polyp       resection was incomplete. The resected tissue was retrieved. Coagulation       for destruction of remaining portion of lesion using argon plasma was       successful. Impression:               - The distal sigmoid colon is normal.                           - One large polyp in the mid rectum, removed with a                            hot snare and removed piecemeal using a hot snare.                            Incomplete resection. Resected tissue retrieved.  Treated with argon plasma coagulation (APC).                           Comment: Flat polyp in depressed area was treated                            with APC. Part of the polyp not treated. Moderate Sedation:      Moderate (conscious) sedation was administered by the endoscopy nurse       and supervised by the endoscopist. The following parameters were       monitored: oxygen saturation, heart rate, blood pressure, CO2       capnography and response to care. Total physician intraservice time was       51 minutes. Recommendation:           - Discharge patient to home (with escort).                            - Resume previous diet today.                           - Continue present medications.                           - Await pathology results. Procedure Code(s):        --- Professional ---                           [phone], Sigmoidoscopy, flexible; with removal of                            tumor(s), polyp(s), or other lesion(s) by snare                            technique                           99153, Moderate sedation; each additional 15                            minutes intraservice time                           99153, Moderate sedation; each additional 15                            minutes intraservice time                           G0500, Moderate sedation services provided by the                            same physician or other qualified health care                            professional performing a gastrointestinal  endoscopic service that sedation supports,                            requiring the presence of an independent trained                            observer to assist in the monitoring of the                            patient's level of consciousness and physiological                            status; initial 15 minutes of intra-service time;                            patient age 39 years or older (additional time may                            be reported with [phone], as appropriate) Diagnosis Code(s):        --- Professional ---                           Z86.010, Personal history of colonic polyps                           K62.1, Rectal polyp CPT copyright 2019 American Medical Association. All rights reserved. The codes documented in this report are preliminary and upon coder review may  be revised to meet current compliance requirements. Hildred Laser, MD Hildred Laser, MD 06/09/2020 1:42:13 PM This report has been signed electronically. Number of Addenda: 0

## 2020-06-09 NOTE — H&P (Signed)
Aaron Nash is an 54 y.o. male.    Chief Complaint: Patient is here for flexible sigmoidoscopy with polypectomy.  HPI: Patient is 54 year old Caucasian male who underwent low anterior resection in November 2019 for large tubulovillous adenoma with intramucosal carcinoma at [facility].  He underwent surveillance colonoscopy by me in April 2021 when he was found to have a large multilobulated polyp just distal to anastomosis.  This polyp could not be raised.  This was removed piecemeal.  Polyp was tubulovillous adenoma with focal high-grade dysplasia but no invasive carcinoma.  He is returning to reexamine the site and remove any residual polyp.  He returned in August for a flexible sigmoidoscopy.  However his prep was poor and this polyp could not be removed.  He was scheduled to come earlier but unfortunately he turned positive for Covid and test to be delayed.   He has no complaints.  He has good appetite.  He denies abdominal pain melena rectal bleeding or diarrhea. Family history is negative for CRC.  Past Medical History:  Diagnosis Date  . Atypical mole 01/08/2019   mild atypia on left upper back (free)  . Atypical mole 01/08/2019   mild atypia on lower right back (free)  . Basal cell carcinoma 12/12/2017   pigmented on right forehead [facility])  . Cancer ([place])    skin cancer, basal cell  . Chronic diarrhea   . Colon cancer ([place]) 2019  . History of colonic polyps 06/2018   Patient states that there was numerous polyps and told one had a cancer seed in it.  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 COLON SURGERY    . COLONOSCOPY  06/2018   Dr.Cathey in [place]  . COLONOSCOPY N/A 11/11/2019   Procedure: COLONOSCOPY;  Surgeon: Rogene Houston, MD;  [facility];  Service: Endoscopy;  Laterality: N/A;  145  . FLEXIBLE SIGMOIDOSCOPY N/A 03/31/2020    Procedure: FLEXIBLE SIGMOIDOSCOPY;  Surgeon: Rogene Houston, MD;  Location: [facility];  Service: Endoscopy;  Laterality: N/A;  200, per office pt knows new arrival time 6:30  . FRACTURE SURGERY Left    leg - injury as a child  . POLYPECTOMY  11/11/2019   Procedure: POLYPECTOMY;  Surgeon: Rogene Houston, MD;  Location: [facility];  Service: Endoscopy;;  Hepatic and ascending colon polyp biopsy forcep,  rectal polyp distal to anastamosis, large rectal polyp (piecemeal) hot snare.    Family History  Problem Relation Age of Onset  . Osteoporosis Mother   . Hypertension Father   . Healthy Sister    Social History:  reports that he quit smoking about 5 years ago. His smoking use included cigarettes. He has never used smokeless tobacco. He reports current alcohol use. He reports that he does not use drugs.  Allergies: No Known Allergies  Medications Prior to Admission  Medication Sig Dispense Refill  . rosuvastatin (CRESTOR) 5 MG tablet Take 5 mg by mouth daily.    Marland Kitchen dicyclomine (BENTYL) 10 MG capsule Take 1 capsule (10 mg total) by mouth 3 (three) times daily as needed for spasms. (Patient not taking: Reported on 03/25/2020) 90 capsule 5  . sildenafil (VIAGRA) 100 MG tablet Take 100 mg by mouth as needed for erectile dysfunction.       No results found for this or any previous visit (from the past 48  hour(s)). No results found.  Review of Systems  Blood pressure 103/67, pulse (!) 59, temperature 97.9 F (36.6 C), temperature source Oral, resp. rate 16, height 5\' 9"  (1.753 m), weight 74.8 kg, SpO2 100 %. Physical Exam HENT:     Mouth/Throat:     Mouth: Mucous membranes are moist.     Pharynx: Oropharynx is clear.  Eyes:     General: No scleral icterus.    Conjunctiva/sclera: Conjunctivae normal.  Cardiovascular:     Rate and Rhythm: Normal rate and regular rhythm.     Heart sounds: Normal heart sounds. No gallop.   Pulmonary:     Effort: Pulmonary effort is normal.      Breath sounds: Normal breath sounds.  Abdominal:     Comments: Abdomen is symmetrical.  He has no midline scar.  On palpation abdomen is soft and nontender with organomegaly or masses.  Musculoskeletal:        General: No swelling.     Cervical back: Neck supple.  Lymphadenopathy:     Cervical: No cervical adenopathy.  Skin:    General: Skin is warm and dry.  Neurological:     Mental Status: He is alert.      Assessment/Plan  History of advanced rectal adenoma. Flexible sigmoidoscopy with polypectomy of residual polyp.  Hildred Laser, MD 06/09/2020, 12:32 PM

## 2020-06-09 NOTE — Discharge Instructions (Signed)
No aspirin or NSAIDs for 2 weeks. Resume usual medications and diet as before. No driving for 24 hours. Physician will call with biopsy results.  PATIENT INSTRUCTIONS POST-ANESTHESIA  IMMEDIATELY FOLLOWING SURGERY:  Do not drive or operate machinery for the first twenty four hours after surgery.  Do not make any important decisions for twenty four hours after surgery or while taking narcotic pain medications or sedatives.  If you develop intractable nausea and vomiting or a severe headache please notify your doctor immediately.  FOLLOW-UP:  Please make an appointment with your surgeon as instructed. You do not need to follow up with anesthesia unless specifically instructed to do so.  WOUND CARE INSTRUCTIONS (if applicable):  Keep a dry clean dressing on the anesthesia/puncture wound site if there is drainage.  Once the wound has quit draining you may leave it open to air.  Generally you should leave the bandage intact for twenty four hours unless there is drainage.  If the epidural site drains for more than 36-48 hours please call the anesthesia department.  QUESTIONS?:  Please feel free to call your physician or the hospital operator if you have any questions, and they will be happy to assist you.      Flexible Sigmoidoscopy, Care After This sheet gives you information about how to care for yourself after your procedure. Your health care provider may also give you more specific instructions. If you have problems or questions, contact your health care provider. What can I expect after the procedure? After the procedure, it is common to have:  Abdominal cramping or pain.  Bloating.  A small amount of rectal bleeding if you had a biopsy. Follow these instructions at home:  Take over-the-counter and prescription medicines only as told by your health care provider.  Do not drive for 24 hours if you received a medicine to help you relax (sedative).  Keep all follow-up visits as told by  your health care provider. This is important. Contact a health care provider if:  You have abdominal pain or cramping that gets worse or is not helped with medicine.  You continue to have small amounts of rectal bleeding after 24 hours.  You have nausea or vomiting.  You feel weak or dizzy.  You have a fever. Get help right away if:  You pass large blood clots or see a large amount of blood in the toilet after having a bowel movement.  You have nausea or vomiting for more than 24 hours after the procedure. This information is not intended to replace advice given to you by your health care provider. Make sure you discuss any questions you have with your health care provider. Document Revised: 03/15/2016 Document Reviewed: 10/22/2015 Elsevier Patient Education  [place].    Colon Polyps  Polyps are tissue growths inside the body. Polyps can grow in many places, including the large intestine (colon). A polyp may be a round bump or a mushroom-shaped growth. You could have one polyp or several. Most colon polyps are noncancerous (benign). However, some colon polyps can become cancerous over time. Finding and removing the polyps early can help prevent this. What are the causes? The exact cause of colon polyps is not known. What increases the risk? You are more likely to develop this condition if you:  Have a family history of colon cancer or colon polyps.  Are older than 78 or older than 45 if you are African American.  Have inflammatory bowel disease, such as ulcerative colitis  or Crohn's disease.  Have certain hereditary conditions, such as: ? Familial adenomatous polyposis. ? Lynch syndrome. ? Turcot syndrome. ? Peutz-Jeghers syndrome.  Are overweight.  Smoke cigarettes.  Do not get enough exercise.  Drink too much alcohol.  Eat a diet that is high in fat and red meat and low in fiber.  Had childhood cancer that was treated with abdominal radiation. What  are the signs or symptoms? Most polyps do not cause symptoms. If you have symptoms, they may include:  Blood coming from your rectum when having a bowel movement.  Blood in your stool. The stool may look dark red or black.  Abdominal pain.  A change in bowel habits, such as constipation or diarrhea. How is this diagnosed? This condition is diagnosed with a colonoscopy. This is a procedure in which a lighted, flexible scope is inserted into the anus and then passed into the colon to examine the area. Polyps are sometimes found when a colonoscopy is done as part of routine cancer screening tests. How is this treated? Treatment for this condition involves removing any polyps that are found. Most polyps can be removed during a colonoscopy. Those polyps will then be tested for cancer. Additional treatment may be needed depending on the results of testing. Follow these instructions at home: Lifestyle  Maintain a healthy weight, or lose weight if recommended by your health care provider.  Exercise every day or as told by your health care provider.  Do not use any products that contain nicotine or tobacco, such as cigarettes and e-cigarettes. If you need help quitting, ask your health care provider.  If you drink alcohol, limit how much you have: ? 0-1 drink a day for women. ? 0-2 drinks a day for men.  Be aware of how much alcohol is in your drink. In the [place] one drink equals one 12 oz bottle of beer (355 mL), one 5 oz glass of wine (148 mL), or one 1 oz shot of hard liquor (44 mL). Eating and drinking   Eat foods that are high in fiber, such as fruits, vegetables, and whole grains.  Eat foods that are high in calcium and vitamin D, such as milk, cheese, yogurt, eggs, liver, fish, and broccoli.  Limit foods that are high in fat, such as fried foods and desserts.  Limit the amount of red meat and processed meat you eat, such as hot dogs, sausage, bacon, and lunch meats. General  instructions  Keep all follow-up visits as told by your health care provider. This is important. ? This includes having regularly scheduled colonoscopies. ? Talk to your health care provider about when you need a colonoscopy. Contact a health care provider if:  You have new or worsening bleeding during a bowel movement.  You have new or increased blood in your stool.  You have a change in bowel habits.  You lose weight for no known reason. Summary  Polyps are tissue growths inside the body. Polyps can grow in many places, including the colon.  Most colon polyps are noncancerous (benign), but some can become cancerous over time.  This condition is diagnosed with a colonoscopy.  Treatment for this condition involves removing any polyps that are found. Most polyps can be removed during a colonoscopy. This information is not intended to replace advice given to you by your health care provider. Make sure you discuss any questions you have with your health care provider. Document Revised: 11/07/2017 Document Reviewed: 11/07/2017 Elsevier Patient Education  [id number]  [employer].

## 2020-06-10 LAB — SURGICAL PATHOLOGY

## 2020-06-13 ENCOUNTER — Encounter (INDEPENDENT_AMBULATORY_CARE_PROVIDER_SITE_OTHER): Payer: Self-pay

## 2020-06-15 ENCOUNTER — Encounter (HOSPITAL_COMMUNITY): Payer: Self-pay | Admitting: Internal Medicine

## 2020-07-18 ENCOUNTER — Ambulatory Visit: Payer: Self-pay | Admitting: Surgery

## 2020-07-19 ENCOUNTER — Other Ambulatory Visit: Payer: Self-pay | Admitting: Surgery

## 2020-07-19 ENCOUNTER — Other Ambulatory Visit (HOSPITAL_COMMUNITY): Payer: Self-pay | Admitting: Surgery

## 2020-07-19 DIAGNOSIS — R160 Hepatomegaly, not elsewhere classified: Secondary | ICD-10-CM

## 2020-07-26 ENCOUNTER — Ambulatory Visit (HOSPITAL_COMMUNITY)
Admission: RE | Admit: 2020-07-26 | Discharge: 2020-07-26 | Disposition: A | Discharge status: Home / Self Care | Payer: 59 | Source: Ambulatory Visit | Attending: Surgery | Admitting: Surgery

## 2020-07-26 ENCOUNTER — Other Ambulatory Visit: Payer: Self-pay

## 2020-07-26 DIAGNOSIS — R160 Hepatomegaly, not elsewhere classified: Secondary | ICD-10-CM | POA: Insufficient documentation

## 2020-07-26 MED ORDER — IOHEXOL 300 MG/ML  SOLN
75.0000 mL | Freq: Once | INTRAMUSCULAR | Status: AC | PRN
  Administered 2020-07-26: 14:00:00 75 mL via INTRAVENOUS

## 2020-08-06 DIAGNOSIS — U071 COVID-19: Secondary | ICD-10-CM | POA: Insufficient documentation

## 2020-08-08 NOTE — Patient Instructions (Addendum)
DUE TO COVID-19 ONLY ONE VISITOR IS ALLOWED TO COME WITH YOU AND STAY IN THE WAITING ROOM ONLY DURING PRE OP AND PROCEDURE DAY OF SURGERY. THE 1 VISITOR  MAY VISIT WITH YOU AFTER SURGERY IN YOUR PRIVATE ROOM DURING VISITING HOURS ONLY!  YOU NEED TO HAVE A COVID 19 TEST ON: 08/16/20 @ 2:55 PM, THIS TEST MUST BE DONE BEFORE SURGERY,  COVID TESTING SITE [place] [id number], IT IS ON THE RIGHT GOING OUT [address] APPROXIMATELY  2 MINUTES PAST [employer] ON THE RIGHT. ONCE YOUR COVID TEST IS COMPLETED,  PLEASE BEGIN THE QUARANTINE INSTRUCTIONS AS OUTLINED IN YOUR HANDOUT.                Aaron Nash    Your procedure is scheduled on: 08/19/20   Report to [facility] Main  Entrance   Report to admitting at: 9:30 AM     Call this number if you have problems the morning of surgery [phone]    Remember:   DRINK 2 PRESURGERY ENSURE DRINKS THE NIGHT BEFORE SURGERY AT  1000 PM AND 1 PRESURGERY DRINK THE DAY OF THE PROCEDURE 3 HOURS PRIOR TO SCHEDULED SURGERY. NO SOLIDS AFTER MIDNIGHT THE DAY PRIOR TO THE SURGERY. NOTHING BY MOUTH EXCEPT CLEAR LIQUIDS UNTIL THREE HOURS PRIOR TO SCHEDULED SURGERY. PLEASE FINISH PRESURGERY ENSURE DRINK PER SURGEON ORDER 3 HOURS PRIOR TO SCHEDULED SURGERY TIME WHICH NEEDS TO BE COMPLETED AT: 8:30 AM.  CLEAR LIQUID DIET   Foods Allowed                                                                     Foods Excluded  Coffee and tea, regular and decaf                             liquids that you cannot  Plain Jell-O any favor except red or purple                                           see through such as: Fruit ices (not with fruit pulp)                                     milk, soups, orange juice  Iced Popsicles                                    All solid food Carbonated beverages, regular and diet                                    Cranberry, grape and apple juices Sports drinks like Gatorade Lightly  seasoned clear broth or consume(fat free) Sugar, honey syrup  Sample Menu Breakfast  Lunch                                     Supper Cranberry juice                    Beef broth                            Chicken broth Jell-O                                     Grape juice                           Apple juice Coffee or tea                        Jell-O                                      Popsicle                                                Coffee or tea                        Coffee or tea  _____________________________________________________________________  DRINK PLENTY CLEAR LIQUIDS THE DAY OF THE PREP.  BRUSH YOUR TEETH MORNING OF SURGERY AND RINSE YOUR MOUTH OUT, NO CHEWING GUM CANDY OR MINTS.    Take these medicines the morning of surgery with A SIP OF WATER: flagyl,neomycin.                               You may not have any metal on your body including hair pins and              piercings  Do not wear jewelry, lotions, powders or perfumes, deodorant             Men may shave face and neck.   Do not bring valuables to the hospital. [place].  Contacts, dentures or bridgework may not be worn into surgery.  Leave suitcase in the car. After surgery it may be brought to your room.     Patients discharged the day of surgery will not be allowed to drive home. IF YOU ARE HAVING SURGERY AND GOING HOME THE SAME DAY, YOU MUST HAVE AN ADULT TO DRIVE YOU HOME AND BE WITH YOU FOR 24 HOURS. YOU MAY GO HOME BY TAXI OR UBER OR ORTHERWISE, BUT AN ADULT MUST ACCOMPANY YOU HOME AND STAY WITH YOU FOR 24 HOURS.  Name and phone number of your driver:  Special Instructions: N/A              Please read over the following fact sheets you were given: _____________________________________________________________________        [facility] - Preparing  for Surgery Before surgery, you can play an important role.   Because skin is not sterile, your skin needs to be as free of germs as possible.  You can reduce the number of germs on your skin by washing with CHG (chlorahexidine gluconate) soap before surgery.  CHG is an antiseptic cleaner which kills germs and bonds with the skin to continue killing germs even after washing. Please DO NOT use if you have an allergy to CHG or antibacterial soaps.  If your skin becomes reddened/irritated stop using the CHG and inform your nurse when you arrive at Short Stay. Do not shave (including legs and underarms) for at least 48 hours prior to the first CHG shower.  You may shave your face/neck. Please follow these instructions carefully:  1.  Shower with CHG Soap the night before surgery and the  morning of Surgery.  2.  If you choose to wash your hair, wash your hair first as usual with your  normal  shampoo.  3.  After you shampoo, rinse your hair and body thoroughly to remove the  shampoo.                           4.  Use CHG as you would any other liquid soap.  You can apply chg directly  to the skin and wash                       Gently with a scrungie or clean washcloth.  5.  Apply the CHG Soap to your body ONLY FROM THE NECK DOWN.   Do not use on face/ open                           Wound or open sores. Avoid contact with eyes, ears mouth and genitals (private parts).                       Wash face,  Genitals (private parts) with your normal soap.             6.  Wash thoroughly, paying special attention to the area where your surgery  will be performed.  7.  Thoroughly rinse your body with warm water from the neck down.  8.  DO NOT shower/wash with your normal soap after using and rinsing off  the CHG Soap.                9.  Pat yourself dry with a clean towel.            10.  Wear clean pajamas.            11.  Place clean sheets on your bed the night of your first shower and do not  sleep with pets. Day of Surgery : Do not apply any lotions/deodorants the  morning of surgery.  Please wear clean clothes to the hospital/surgery center.  FAILURE TO FOLLOW THESE INSTRUCTIONS MAY RESULT IN THE CANCELLATION OF YOUR SURGERY PATIENT SIGNATURE_________________________________  NURSE SIGNATURE__________________________________  ________________________________________________________________________   Rogelia Mire  An incentive spirometer is a tool that can help keep your lungs clear and active. This tool measures how well you are filling your lungs with each breath. Taking long deep breaths may help reverse or decrease the chance of developing breathing (pulmonary) problems (especially infection) following:  A long period of time when you are  unable to move or be active. BEFORE THE PROCEDURE   If the spirometer includes an indicator to show your best effort, your nurse or respiratory therapist will set it to a desired goal.  If possible, sit up straight or lean slightly forward. Try not to slouch.  Hold the incentive spirometer in an upright position. INSTRUCTIONS FOR USE  1. Sit on the edge of your bed if possible, or sit up as far as you can in bed or on a chair. 2. Hold the incentive spirometer in an upright position. 3. Breathe out normally. 4. Place the mouthpiece in your mouth and seal your lips tightly around it. 5. Breathe in slowly and as deeply as possible, raising the piston or the ball toward the top of the column. 6. Hold your breath for 3-5 seconds or for as long as possible. Allow the piston or ball to fall to the bottom of the column. 7. Remove the mouthpiece from your mouth and breathe out normally. 8. Rest for a few seconds and repeat Steps 1 through 7 at least 10 times every 1-2 hours when you are awake. Take your time and take a few normal breaths between deep breaths. 9. The spirometer may include an indicator to show your best effort. Use the indicator as a goal to work toward during each repetition. 10. After each  set of 10 deep breaths, practice coughing to be sure your lungs are clear. If you have an incision (the cut made at the time of surgery), support your incision when coughing by placing a pillow or rolled up towels firmly against it. Once you are able to get out of bed, walk around indoors and cough well. You may stop using the incentive spirometer when instructed by your caregiver.  RISKS AND COMPLICATIONS  Take your time so you do not get dizzy or light-headed.  If you are in pain, you may need to take or ask for pain medication before doing incentive spirometry. It is harder to take a deep breath if you are having pain. AFTER USE  Rest and breathe slowly and easily.  It can be helpful to keep track of a log of your progress. Your caregiver can provide you with a simple table to help with this. If you are using the spirometer at home, follow these instructions: [place] IF:   You are having difficultly using the spirometer.  You have trouble using the spirometer as often as instructed.  Your pain medication is not giving enough relief while using the spirometer.  You develop fever of 100.5 F (38.1 C) or higher. SEEK IMMEDIATE MEDICAL CARE IF:   You cough up bloody sputum that had not been present before.  You develop fever of 102 F (38.9 C) or greater.  You develop worsening pain at or near the incision site. MAKE SURE YOU:   Understand these instructions.  Will watch your condition.  Will get help right away if you are not doing well or get worse. Document Released: 12/03/2006 Document Revised: 10/15/2011 Document Reviewed: 02/03/2007 [facility] Patient Information 2014 [place], [place].   ________________________________________________________________________

## 2020-08-09 ENCOUNTER — Encounter (HOSPITAL_COMMUNITY): Payer: Self-pay

## 2020-08-09 ENCOUNTER — Other Ambulatory Visit: Payer: Self-pay

## 2020-08-09 ENCOUNTER — Encounter (HOSPITAL_COMMUNITY)
Admission: RE | Admit: 2020-08-09 | Discharge: 2020-08-09 | Disposition: A | Discharge status: Home / Self Care | Payer: 59 | Source: Ambulatory Visit | Attending: Surgery | Admitting: Surgery

## 2020-08-09 DIAGNOSIS — Z01812 Encounter for preprocedural laboratory examination: Secondary | ICD-10-CM | POA: Insufficient documentation

## 2020-08-09 LAB — HEMOGLOBIN A1C
Hgb A1c MFr Bld: 5.6 % (ref 4.8–5.6)
Mean Plasma Glucose: 114.02 mg/dL

## 2020-08-09 LAB — CBC
HCT: 44.9 % (ref 39.0–52.0)
Hemoglobin: 14.9 g/dL (ref 13.0–17.0)
MCH: 29.7 pg (ref 26.0–34.0)
MCHC: 33.2 g/dL (ref 30.0–36.0)
MCV: 89.6 fL (ref 80.0–100.0)
Platelets: 291 10*3/uL (ref 150–400)
RBC: 5.01 MIL/uL (ref 4.22–5.81)
RDW: 15 % (ref 11.5–15.5)
WBC: 10.8 10*3/uL — ABNORMAL HIGH (ref 4.0–10.5)
nRBC: 0 % (ref 0.0–0.2)

## 2020-08-09 NOTE — Consult Note (Signed)
WOC Nurse requested for preoperative stoma site marking  Discussed surgical procedure and stoma creation with patient and family.  Explained role of the WOC nurse team.  Provided the patient with educational booklet and provided samples of pouching options.  Answered patient and family questions.   Examined patient lying, sitting, and standing in order to place the marking in the patient's visual field, away from any creases or abdominal contour issues and within the rectus muscle.  Attempted to mark below the patient's belt line.   Marked for colostomy in the LLQ  _4___ cm to the left of the umbilicus and _1___cm above the umbilicus.  Marked for ileostomy in the RLQ  _3___cm to the right of the umbilicus and  _1___ cm above the umbilicus.     Patient's abdomen cleansed with CHG wipes at site markings, allowed to air dry prior to marking.Covered mark with thin film transparent dressing to preserve mark until date of surgery.   WOC Nurse team will follow up with patient after surgery for continue ostomy care and teaching.  Valarie Farace [facility] MSN, RN,CWOCN, CNS, [employer] [phone]

## 2020-08-09 NOTE — Progress Notes (Addendum)
COVID Vaccine Completed: Yes Date COVID Vaccine completed: COVID vaccine manufacturer: Pfizer     PCP - No PCP Cardiologist -   Chest x-ray -  EKG -  Stress Test -  ECHO -  Cardiac Cath -  Pacemaker/ICD device last checked:  Sleep Study -  CPAP -   Fasting Blood Sugar -  Checks Blood Sugar _____ times a day  Blood Thinner Instructions: Aspirin Instructions: Last Dose:  Anesthesia review:   Patient denies shortness of breath, fever, cough and chest pain at PAT appointment   Patient verbalized understanding of instructions that were given to them at the PAT appointment. Patient was also instructed that they will need to review over the PAT instructions again at home before surgery.

## 2020-08-10 LAB — CEA: CEA: 1.1 ng/mL (ref 0.0–4.7)

## 2020-08-16 ENCOUNTER — Other Ambulatory Visit (HOSPITAL_COMMUNITY)
Admission: RE | Admit: 2020-08-16 | Discharge: 2020-08-16 | Disposition: A | Discharge status: Home / Self Care | Payer: 59 | Source: Ambulatory Visit | Attending: Surgery | Admitting: Surgery

## 2020-08-16 DIAGNOSIS — U071 COVID-19: Secondary | ICD-10-CM | POA: Insufficient documentation

## 2020-08-16 DIAGNOSIS — Z01812 Encounter for preprocedural laboratory examination: Secondary | ICD-10-CM | POA: Diagnosis present

## 2020-08-16 LAB — SARS CORONAVIRUS 2 (TAT 6-24 HRS): SARS Coronavirus 2: POSITIVE — AB

## 2020-08-17 ENCOUNTER — Encounter: Payer: Self-pay | Admitting: Nurse Practitioner

## 2020-08-17 ENCOUNTER — Telehealth: Payer: Self-pay | Admitting: Nurse Practitioner

## 2020-08-17 DIAGNOSIS — C189 Malignant neoplasm of colon, unspecified: Secondary | ICD-10-CM

## 2020-08-17 DIAGNOSIS — U071 COVID-19: Secondary | ICD-10-CM

## 2020-08-17 NOTE — Telephone Encounter (Signed)
I called Aaron Nash to discuss Covid symptoms and the use of Sotrovimab, a monoclonal antibody infusion for those with mild to moderate Covid symptoms and at a high risk of hospitalization.     Pt does not qualify for infusion therapy as pt has asymptomatic infection. Isolation precautions discussed. Advised to contact back for consideration should they develop symptoms. Patient verbalized understanding.      Patient Active Problem List   Diagnosis Date Noted  . COVID-19 virus infection 08/2020  . History of colon cancer 10/27/2019  . IBS (irritable bowel syndrome) 10/27/2019  . Colon cancer ([place]) 2019    Murray Hodgkins, NP

## 2020-09-05 ENCOUNTER — Other Ambulatory Visit: Payer: Self-pay

## 2020-09-05 ENCOUNTER — Encounter: Payer: Self-pay | Admitting: Dermatology

## 2020-09-05 ENCOUNTER — Ambulatory Visit (INDEPENDENT_AMBULATORY_CARE_PROVIDER_SITE_OTHER): Payer: 59 | Admitting: Dermatology

## 2020-09-05 DIAGNOSIS — L821 Other seborrheic keratosis: Secondary | ICD-10-CM | POA: Diagnosis not present

## 2020-09-05 DIAGNOSIS — D369 Benign neoplasm, unspecified site: Secondary | ICD-10-CM | POA: Diagnosis not present

## 2020-09-05 DIAGNOSIS — I8390 Asymptomatic varicose veins of unspecified lower extremity: Secondary | ICD-10-CM | POA: Diagnosis not present

## 2020-09-05 NOTE — Patient Instructions (Signed)
Issues discussed on findings related to the left scrotal sac.  The upper of the 2 spots is a small venous cluster that represents a tiny varicosity with a little venous bleb in the middle.  There is no risk of malignancy but there is a small chance that this could bleed.  I recommend he consult with Dr. Moise Boring if he wants a second definitive opinion.  Dr. Kalman Shan is a laser surgeon at [facility].  The lower of the lesions on the left scrotum represents a small skin tag with slight cobblestoning of the surface, so I cannot absolutely exclude another little wart but I favor this thing being nonviral and no risk to Aaron Nash.  On the left mid to back cheek is a small tan 6 mm slightly scaly keratosis that is safe to leave unless there is clinical change.

## 2020-09-11 ENCOUNTER — Encounter: Payer: Self-pay | Admitting: Dermatology

## 2020-09-11 NOTE — Progress Notes (Signed)
   Follow-Up Visit   Subjective  Aaron Nash is a 55 y.o. male who presents for the following: Skin Problem (Patient here today for spot on groin x 3 months per patient the spot isn't painful, no bleeding. New spot, no new sexual partners, sexual partner doesn't have any bumps.).  Goals Location: Scrotum Duration:  Quality:  Associated Signs/Symptoms: Modifying Factors:  Severity:  Timing: Context:   Objective  Well appearing patient in no apparent distress; mood and affect are within normal limits. Objective  Posterior Scrotum: Soft slightly pedunculated tan manner papule with subtly textured surface.  No regional adenopathy.  Patient told the differential diagnosis would be an irritated skin tag, inflamed keratosis, or wart.  Objective  Right Scrotum: Small venous cluster with a 3 mm probably intravascular papule that may be a phlebolith or tiny thrombosis.  Nontender.  Objective  Left Parotid Area: Tan textured 4 mm flattopped papule    All skin waist up examined.  Plus upper legs and anogenital areas.  Regional nodes.   Assessment & Plan    Squamous papilloma Posterior Scrotum  Aaron Nash expressed preference to leave this lesion as long as it is clinically benign and stable.  He knows that he could contact me should there be clinical change.  Asymptomatic varicose veins Right Scrotum  1 of small possibility of bleeding and encouraged to get a second opinion with Dr. Moise Boring at [facility].  Seborrheic keratosis Left Parotid Area  Stable      I, Lavonna Monarch, MD, have reviewed all documentation for this visit.  The documentation on 09/11/20 for the exam, diagnosis, procedures, and orders are all accurate and complete.

## 2020-09-13 NOTE — Patient Instructions (Signed)
DUE TO COVID-19 ONLY ONE VISITOR IS ALLOWED TO COME WITH YOU AND STAY IN THE WAITING ROOM ONLY DURING PRE OP AND PROCEDURE DAY OF SURGERY. THE 1 VISITOR  MAY VISIT WITH YOU AFTER SURGERY IN YOUR PRIVATE ROOM DURING VISITING HOURS ONLY!                Aaron Nash    Your procedure is scheduled on: 09/21/20   Report to [facility] Main  Entrance   Report to admitting at: 6:30 AM     Call this number if you have problems the morning of surgery [phone]    Remember:  DRINK 2 [place] AT  1000 PM AND 1 PRESURGERY DRINK THE DAY OF THE PROCEDURE 3 HOURS PRIOR TO SCHEDULED SURGERY. NO SOLIDS AFTER MIDNIGHT THE DAY PRIOR TO THE SURGERY. NOTHING BY MOUTH EXCEPT CLEAR LIQUIDS UNTIL THREE HOURS PRIOR TO SCHEDULED SURGERY. PLEASE FINISH PRESURGERY ENSURE DRINK PER SURGEON ORDER 3 HOURS PRIOR TO SCHEDULED SURGERY TIME WHICH NEEDS TO BE COMPLETED AT: 5:30 AM.  CLEAR LIQUID DIET  Foods Allowed                                                                     Foods Excluded  Coffee and tea, regular and decaf                             liquids that you cannot  Plain Jell-O any favor except red or purple                                           see through such as: Fruit ices (not with fruit pulp)                                     milk, soups, orange juice  Iced Popsicles                                    All solid food Carbonated beverages, regular and diet                                    Cranberry, grape and apple juices Sports drinks like Gatorade Lightly seasoned clear broth or consume(fat free) Sugar, honey syrup  Sample Menu Breakfast                                Lunch                                     Supper Cranberry juice                    Beef broth  Chicken broth Jell-O                                     Grape juice                           Apple juice Coffee or tea                         Jell-O                                      Popsicle                                                Coffee or tea                        Coffee or tea  _____________________________________________________________________  DRINK PLENTY FLUIDS THE DAY OF THE PREP.   BRUSH YOUR TEETH MORNING OF SURGERY AND RINSE YOUR MOUTH OUT, NO CHEWING GUM CANDY OR MINTS.                               You may not have any metal on your body including hair pins and              piercings  Do not wear jewelry, lotions, powders or perfumes, deodorant             Men may shave face and neck.   Do not bring valuables to the hospital. [place].  Contacts, dentures or bridgework may not be worn into surgery.  Leave suitcase in the car. After surgery it may be brought to your room.     Patients discharged the day of surgery will not be allowed to drive home. IF YOU ARE HAVING SURGERY AND GOING HOME THE SAME DAY, YOU MUST HAVE AN ADULT TO DRIVE YOU HOME AND BE WITH YOU FOR 24 HOURS. YOU MAY GO HOME BY TAXI OR UBER OR ORTHERWISE, BUT AN ADULT MUST ACCOMPANY YOU HOME AND STAY WITH YOU FOR 24 HOURS.  Name and phone number of your driver:  Special Instructions: N/A              Please read over the following fact sheets you were given: _____________________________________________________________________          [facility] - Preparing for Surgery Before surgery, you can play an important role.  Because skin is not sterile, your skin needs to be as free of germs as possible.  You can reduce the number of germs on your skin by washing with CHG (chlorahexidine gluconate) soap before surgery.  CHG is an antiseptic cleaner which kills germs and bonds with the skin to continue killing germs even after washing. Please DO NOT use if you have an allergy to CHG or antibacterial soaps.  If your skin becomes reddened/irritated stop using the CHG and inform your nurse when you  arrive at  Short Stay. Do not shave (including legs and underarms) for at least 48 hours prior to the first CHG shower.  You may shave your face/neck. Please follow these instructions carefully:  1.  Shower with CHG Soap the night before surgery and the  morning of Surgery.  2.  If you choose to wash your hair, wash your hair first as usual with your  normal  shampoo.  3.  After you shampoo, rinse your hair and body thoroughly to remove the  shampoo.                           4.  Use CHG as you would any other liquid soap.  You can apply chg directly  to the skin and wash                       Gently with a scrungie or clean washcloth.  5.  Apply the CHG Soap to your body ONLY FROM THE NECK DOWN.   Do not use on face/ open                           Wound or open sores. Avoid contact with eyes, ears mouth and genitals (private parts).                       Wash face,  Genitals (private parts) with your normal soap.             6.  Wash thoroughly, paying special attention to the area where your surgery  will be performed.  7.  Thoroughly rinse your body with warm water from the neck down.  8.  DO NOT shower/wash with your normal soap after using and rinsing off  the CHG Soap.                9.  Pat yourself dry with a clean towel.            10.  Wear clean pajamas.            11.  Place clean sheets on your bed the night of your first shower and do not  sleep with pets. Day of Surgery : Do not apply any lotions/deodorants the morning of surgery.  Please wear clean clothes to the hospital/surgery center.  FAILURE TO FOLLOW THESE INSTRUCTIONS MAY RESULT IN THE CANCELLATION OF YOUR SURGERY PATIENT SIGNATURE_________________________________  NURSE SIGNATURE__________________________________  ________________________________________________________________________   Aaron Nash  An incentive spirometer is a tool that can help keep your lungs clear and active. This tool measures how  well you are filling your lungs with each breath. Taking long deep breaths may help reverse or decrease the chance of developing breathing (pulmonary) problems (especially infection) following:  A long period of time when you are unable to move or be active. BEFORE THE PROCEDURE   If the spirometer includes an indicator to show your best effort, your nurse or respiratory therapist will set it to a desired goal.  If possible, sit up straight or lean slightly forward. Try not to slouch.  Hold the incentive spirometer in an upright position. INSTRUCTIONS FOR USE  1. Sit on the edge of your bed if possible, or sit up as far as you can in bed or on a chair. 2. Hold the incentive spirometer in an upright position. 3. Breathe out normally. 4. Place the  mouthpiece in your mouth and seal your lips tightly around it. 5. Breathe in slowly and as deeply as possible, raising the piston or the ball toward the top of the column. 6. Hold your breath for 3-5 seconds or for as long as possible. Allow the piston or ball to fall to the bottom of the column. 7. Remove the mouthpiece from your mouth and breathe out normally. 8. Rest for a few seconds and repeat Steps 1 through 7 at least 10 times every 1-2 hours when you are awake. Take your time and take a few normal breaths between deep breaths. 9. The spirometer may include an indicator to show your best effort. Use the indicator as a goal to work toward during each repetition. 10. After each set of 10 deep breaths, practice coughing to be sure your lungs are clear. If you have an incision (the cut made at the time of surgery), support your incision when coughing by placing a pillow or rolled up towels firmly against it. Once you are able to get out of bed, walk around indoors and cough well. You may stop using the incentive spirometer when instructed by your caregiver.  RISKS AND COMPLICATIONS  Take your time so you do not get dizzy or light-headed.  If you  are in pain, you may need to take or ask for pain medication before doing incentive spirometry. It is harder to take a deep breath if you are having pain. AFTER USE  Rest and breathe slowly and easily.  It can be helpful to keep track of a log of your progress. Your caregiver can provide you with a simple table to help with this. If you are using the spirometer at home, follow these instructions: [place] IF:   You are having difficultly using the spirometer.  You have trouble using the spirometer as often as instructed.  Your pain medication is not giving enough relief while using the spirometer.  You develop fever of 100.5 F (38.1 C) or higher. SEEK IMMEDIATE MEDICAL CARE IF:   You cough up bloody sputum that had not been present before.  You develop fever of 102 F (38.9 C) or greater.  You develop worsening pain at or near the incision site. MAKE SURE YOU:   Understand these instructions.  Will watch your condition.  Will get help right away if you are not doing well or get worse. Document Released: 12/03/2006 Document Revised: 10/15/2011 Document Reviewed: 02/03/2007 [facility] Patient Information 2014 [place], [place].   ________________________________________________________________________

## 2020-09-14 ENCOUNTER — Encounter (HOSPITAL_COMMUNITY)
Admission: RE | Admit: 2020-09-14 | Discharge: 2020-09-14 | Disposition: A | Discharge status: Home / Self Care | Payer: 59 | Source: Ambulatory Visit | Attending: Surgery | Admitting: Surgery

## 2020-09-14 ENCOUNTER — Other Ambulatory Visit: Payer: Self-pay

## 2020-09-14 ENCOUNTER — Encounter (HOSPITAL_COMMUNITY): Payer: Self-pay

## 2020-09-14 DIAGNOSIS — Z01812 Encounter for preprocedural laboratory examination: Secondary | ICD-10-CM | POA: Diagnosis not present

## 2020-09-14 LAB — CBC
HCT: 46.1 % (ref 39.0–52.0)
Hemoglobin: 14.8 g/dL (ref 13.0–17.0)
MCH: 29.2 pg (ref 26.0–34.0)
MCHC: 32.1 g/dL (ref 30.0–36.0)
MCV: 90.9 fL (ref 80.0–100.0)
Platelets: 273 10*3/uL (ref 150–400)
RBC: 5.07 MIL/uL (ref 4.22–5.81)
RDW: 14.7 % (ref 11.5–15.5)
WBC: 8.6 10*3/uL (ref 4.0–10.5)
nRBC: 0 % (ref 0.0–0.2)

## 2020-09-14 NOTE — Consult Note (Signed)
[facility] Nurse requested for preoperative stoma site marking  Discussed surgical procedure and stoma creation with patient.  Explained role of the [facility] nurse team.  Provided the patient with educational booklet and provided samples of pouching options.  Answered patient questions.   Examined patient l sitting, and standing in order to place the marking in the patient's visual field, away from any creases or abdominal contour issues and within the rectus muscle.  Attempted to mark below the patient's belt line.   Marked for colostomy in the LLQ  cm to the left of the umbilicus and 4 cm  and 2 cm below the umbilicus.  Marked for ileostomy in the RLQ 4 cm to the right of the umbilicus and  2 cm below the umbilicus.  Patient's abdomen cleansed with CHG wipes at site markings, allowed to air dry prior to marking.Covered mark with thin film transparent dressing to preserve mark until date of surgery.   [facility] Nurse team will follow up with patient after surgery for continue ostomy care and teaching.   Domenic Moras MSN, RN, FNP-BC CWON Wound, Ostomy, Continence Nurse Pager [phone]

## 2020-09-14 NOTE — Progress Notes (Addendum)
COVID Vaccine Completed: Yes Date COVID Vaccine completed: 11/09/19 COVID vaccine manufacturer: Pfizer      PCP - Dr. Berenice Primas Cardiologist -   Chest x-ray -  EKG -  Stress Test -  ECHO -  Cardiac Cath -  Pacemaker/ICD device last checked:  Sleep Study -  CPAP -   Fasting Blood Sugar -  Checks Blood Sugar _____ times a day  Blood Thinner Instructions: Aspirin Instructions: Last Dose:  Anesthesia review:   Patient denies shortness of breath, fever, cough and chest pain at PAT appointment   Patient verbalized understanding of instructions that were given to them at the PAT appointment. Patient was also instructed that they will need to review over the PAT instructions again at home before surgery.

## 2020-09-20 MED ORDER — SODIUM CHLORIDE 0.9 % IV SOLN
INTRAVENOUS | Status: DC
  Filled 2020-09-20: qty 6

## 2020-09-20 MED ORDER — BUPIVACAINE LIPOSOME 1.3 % IJ SUSP
20.0000 mL | INTRAMUSCULAR | Status: DC
  Filled 2020-09-20: qty 20

## 2020-09-20 NOTE — Anesthesia Preprocedure Evaluation (Addendum)
Anesthesia Evaluation  Patient identified by MRN, date of birth, ID band Patient awake    Reviewed: Allergy & Precautions, NPO status , Patient's Chart, lab work & pertinent test results  Airway Mallampati: II  TM Distance: >3 FB     Dental  (+) Dental Advisory Given   Pulmonary former smoker,    breath sounds clear to auscultation       Cardiovascular negative cardio ROS   Rhythm:Regular Rate:Normal     Neuro/Psych negative neurological ROS     GI/Hepatic Neg liver ROS, Rectal mass   Endo/Other  negative endocrine ROS  Renal/GU negative Renal ROS     Musculoskeletal   Abdominal   Peds  Hematology negative hematology ROS (+)   Anesthesia Other Findings   Reproductive/Obstetrics                            Anesthesia Physical Anesthesia Plan  ASA: II  Anesthesia Plan: General   Post-op Pain Management:    Induction: Intravenous  PONV Risk Score and Plan: 3 and Dexamethasone, Ondansetron, Treatment may vary due to age or medical condition and Midazolam  Airway Management Planned: Oral ETT  Additional Equipment:   Intra-op Plan:   Post-operative Plan: Extubation in OR  Informed Consent: I have reviewed the patients History and Physical, chart, labs and discussed the procedure including the risks, benefits and alternatives for the proposed anesthesia with the patient or authorized representative who has indicated his/her understanding and acceptance.     Dental advisory given  Plan Discussed with: CRNA  Anesthesia Plan Comments:        Anesthesia Quick Evaluation

## 2020-09-21 ENCOUNTER — Inpatient Hospital Stay (HOSPITAL_COMMUNITY)
Admission: RE | Admit: 2020-09-21 | Discharge: 2020-09-22 | DRG: 334 | Disposition: A | Discharge status: Home / Self Care | Payer: 59 | Attending: Surgery | Admitting: Surgery

## 2020-09-21 ENCOUNTER — Inpatient Hospital Stay (HOSPITAL_COMMUNITY): Payer: 59 | Admitting: Anesthesiology

## 2020-09-21 ENCOUNTER — Other Ambulatory Visit: Payer: Self-pay

## 2020-09-21 ENCOUNTER — Encounter (HOSPITAL_COMMUNITY)
Admission: RE | Disposition: A | Discharge status: Home / Self Care | Payer: Self-pay | Source: Home / Self Care | Attending: Surgery

## 2020-09-21 ENCOUNTER — Encounter (HOSPITAL_COMMUNITY): Payer: Self-pay | Admitting: Surgery

## 2020-09-21 DIAGNOSIS — Z8616 Personal history of COVID-19: Secondary | ICD-10-CM | POA: Diagnosis not present

## 2020-09-21 DIAGNOSIS — Z86010 Personal history of colonic polyps: Secondary | ICD-10-CM

## 2020-09-21 DIAGNOSIS — Z85048 Personal history of other malignant neoplasm of rectum, rectosigmoid junction, and anus: Secondary | ICD-10-CM

## 2020-09-21 DIAGNOSIS — K769 Liver disease, unspecified: Secondary | ICD-10-CM | POA: Diagnosis present

## 2020-09-21 DIAGNOSIS — Z85828 Personal history of other malignant neoplasm of skin: Secondary | ICD-10-CM | POA: Diagnosis not present

## 2020-09-21 DIAGNOSIS — Z87891 Personal history of nicotine dependence: Secondary | ICD-10-CM

## 2020-09-21 DIAGNOSIS — K621 Rectal polyp: Principal | ICD-10-CM

## 2020-09-21 DIAGNOSIS — K635 Polyp of colon: Secondary | ICD-10-CM | POA: Diagnosis present

## 2020-09-21 DIAGNOSIS — C19 Malignant neoplasm of rectosigmoid junction: Secondary | ICD-10-CM | POA: Diagnosis present

## 2020-09-21 DIAGNOSIS — N529 Male erectile dysfunction, unspecified: Secondary | ICD-10-CM | POA: Diagnosis not present

## 2020-09-21 HISTORY — PX: XI ROBOT ASSISTED TRANSANAL RESECTION: SHX6848

## 2020-09-21 SURGERY — XI ROBOT ASSISTED TRANSANAL RESECTION
Anesthesia: General

## 2020-09-21 MED ORDER — CALCIUM POLYCARBOPHIL 625 MG PO TABS
625.0000 mg | ORAL_TABLET | Freq: Two times a day (BID) | ORAL | Status: DC
  Administered 2020-09-21 – 2020-09-22 (×2): 625 mg via ORAL
  Filled 2020-09-21 (×2): qty 1

## 2020-09-21 MED ORDER — BUPIVACAINE-EPINEPHRINE (PF) 0.25% -1:200000 IJ SOLN
INTRAMUSCULAR | Status: AC
  Filled 2020-09-21: qty 30

## 2020-09-21 MED ORDER — METRONIDAZOLE 500 MG PO TABS: 1000.0000 mg | ORAL_TABLET | ORAL | Status: DC

## 2020-09-21 MED ORDER — ENSURE SURGERY PO LIQD
237.0000 mL | Freq: Two times a day (BID) | ORAL | Status: DC
  Administered 2020-09-21: 237 mL via ORAL

## 2020-09-21 MED ORDER — GABAPENTIN 300 MG PO CAPS
300.0000 mg | ORAL_CAPSULE | ORAL | Status: AC
  Administered 2020-09-21: 300 mg via ORAL
  Filled 2020-09-21: qty 1

## 2020-09-21 MED ORDER — LACTATED RINGERS IV SOLN: INTRAVENOUS | Status: DC

## 2020-09-21 MED ORDER — ONDANSETRON HCL 4 MG/2ML IJ SOLN
INTRAMUSCULAR | Status: DC | PRN
  Administered 2020-09-21: 4 mg via INTRAVENOUS

## 2020-09-21 MED ORDER — ENSURE PRE-SURGERY PO LIQD
296.0000 mL | Freq: Once | ORAL | Status: DC
  Filled 2020-09-21: qty 296

## 2020-09-21 MED ORDER — FENTANYL CITRATE (PF) 100 MCG/2ML IJ SOLN
INTRAMUSCULAR | Status: AC
  Filled 2020-09-21: qty 2

## 2020-09-21 MED ORDER — ACETAMINOPHEN 500 MG PO TABS
1000.0000 mg | ORAL_TABLET | ORAL | Status: AC
  Administered 2020-09-21: 1000 mg via ORAL
  Filled 2020-09-21: qty 2

## 2020-09-21 MED ORDER — DIBUCAINE (PERIANAL) 1 % EX OINT
TOPICAL_OINTMENT | CUTANEOUS | Status: DC | PRN
  Administered 2020-09-21: 1 via RECTAL

## 2020-09-21 MED ORDER — SODIUM CHLORIDE 0.9 % IV SOLN
2.0000 g | Freq: Two times a day (BID) | INTRAVENOUS | Status: AC
  Administered 2020-09-21: 21:00:00 2 g via INTRAVENOUS
  Filled 2020-09-21: qty 2

## 2020-09-21 MED ORDER — PHENYLEPHRINE 40 MCG/ML (10ML) SYRINGE FOR IV PUSH (FOR BLOOD PRESSURE SUPPORT)
PREFILLED_SYRINGE | INTRAVENOUS | Status: DC | PRN
  Administered 2020-09-21: 80 ug via INTRAVENOUS
  Administered 2020-09-21: 120 ug via INTRAVENOUS
  Administered 2020-09-21 (×2): 80 ug via INTRAVENOUS

## 2020-09-21 MED ORDER — DIPHENHYDRAMINE HCL 50 MG/ML IJ SOLN: 12.5000 mg | Freq: Four times a day (QID) | INTRAMUSCULAR | Status: DC | PRN

## 2020-09-21 MED ORDER — FENTANYL CITRATE (PF) 250 MCG/5ML IJ SOLN
INTRAMUSCULAR | Status: AC
  Filled 2020-09-21: qty 5

## 2020-09-21 MED ORDER — LIP MEDEX EX OINT
1.0000 "application " | TOPICAL_OINTMENT | Freq: Two times a day (BID) | CUTANEOUS | Status: DC
  Administered 2020-09-21 (×2): 1 via TOPICAL
  Filled 2020-09-21: qty 7

## 2020-09-21 MED ORDER — BISACODYL 5 MG PO TBEC: 20.0000 mg | DELAYED_RELEASE_TABLET | Freq: Once | ORAL | Status: DC

## 2020-09-21 MED ORDER — CELECOXIB 200 MG PO CAPS
200.0000 mg | ORAL_CAPSULE | ORAL | Status: AC
  Administered 2020-09-21: 200 mg via ORAL
  Filled 2020-09-21: qty 1

## 2020-09-21 MED ORDER — MIDAZOLAM HCL 2 MG/2ML IJ SOLN
INTRAMUSCULAR | Status: AC
  Filled 2020-09-21: qty 2

## 2020-09-21 MED ORDER — PROCHLORPERAZINE EDISYLATE 10 MG/2ML IJ SOLN: 5.0000 mg | Freq: Four times a day (QID) | INTRAMUSCULAR | Status: DC | PRN

## 2020-09-21 MED ORDER — HYDROMORPHONE HCL 1 MG/ML IJ SOLN: 0.5000 mg | INTRAMUSCULAR | Status: DC | PRN

## 2020-09-21 MED ORDER — ENOXAPARIN SODIUM 40 MG/0.4ML ~~LOC~~ SOLN
40.0000 mg | SUBCUTANEOUS | Status: DC
  Administered 2020-09-22: 40 mg via SUBCUTANEOUS
  Filled 2020-09-21: qty 0.4

## 2020-09-21 MED ORDER — MAGIC MOUTHWASH
15.0000 mL | Freq: Four times a day (QID) | ORAL | Status: DC | PRN
  Filled 2020-09-21: qty 15

## 2020-09-21 MED ORDER — DEXAMETHASONE SODIUM PHOSPHATE 10 MG/ML IJ SOLN
INTRAMUSCULAR | Status: DC | PRN
  Administered 2020-09-21: 5 mg via INTRAVENOUS

## 2020-09-21 MED ORDER — DIPHENHYDRAMINE HCL 12.5 MG/5ML PO ELIX
12.5000 mg | ORAL_SOLUTION | Freq: Four times a day (QID) | ORAL | Status: DC | PRN
Start: 2020-09-21 — End: 2020-09-22

## 2020-09-21 MED ORDER — PROPOFOL 10 MG/ML IV BOLUS
INTRAVENOUS | Status: DC | PRN
  Administered 2020-09-21: 120 mg via INTRAVENOUS

## 2020-09-21 MED ORDER — KETAMINE HCL 10 MG/ML IJ SOLN
INTRAMUSCULAR | Status: AC
  Filled 2020-09-21: qty 1

## 2020-09-21 MED ORDER — BUPIVACAINE-EPINEPHRINE 0.25% -1:200000 IJ SOLN
INTRAMUSCULAR | Status: DC | PRN
  Administered 2020-09-21: 12 mL

## 2020-09-21 MED ORDER — SUGAMMADEX SODIUM 200 MG/2ML IV SOLN
INTRAVENOUS | Status: DC | PRN
  Administered 2020-09-21: 200 mg via INTRAVENOUS

## 2020-09-21 MED ORDER — CHLORHEXIDINE GLUCONATE 0.12 % MT SOLN
15.0000 mL | Freq: Once | OROMUCOSAL | Status: AC
  Administered 2020-09-21: 15 mL via OROMUCOSAL

## 2020-09-21 MED ORDER — ENOXAPARIN SODIUM 40 MG/0.4ML ~~LOC~~ SOLN
40.0000 mg | Freq: Once | SUBCUTANEOUS | Status: AC
  Administered 2020-09-21: 40 mg via SUBCUTANEOUS
  Filled 2020-09-21: qty 0.4

## 2020-09-21 MED ORDER — ENALAPRILAT 1.25 MG/ML IV SOLN
0.6250 mg | Freq: Four times a day (QID) | INTRAVENOUS | Status: DC | PRN
  Filled 2020-09-21: qty 1

## 2020-09-21 MED ORDER — GABAPENTIN 100 MG PO CAPS
200.0000 mg | ORAL_CAPSULE | Freq: Three times a day (TID) | ORAL | Status: DC
  Administered 2020-09-21 – 2020-09-22 (×3): 200 mg via ORAL
  Filled 2020-09-21 (×3): qty 2

## 2020-09-21 MED ORDER — EPHEDRINE SULFATE-NACL 50-0.9 MG/10ML-% IV SOSY
PREFILLED_SYRINGE | INTRAVENOUS | Status: DC | PRN
  Administered 2020-09-21 (×2): 10 mg via INTRAVENOUS

## 2020-09-21 MED ORDER — ALVIMOPAN 12 MG PO CAPS
12.0000 mg | ORAL_CAPSULE | ORAL | Status: AC
  Administered 2020-09-21: 12 mg via ORAL
  Filled 2020-09-21: qty 1

## 2020-09-21 MED ORDER — LACTATED RINGERS IR SOLN
Status: DC | PRN
  Administered 2020-09-21: 3000 mL

## 2020-09-21 MED ORDER — ACETAMINOPHEN 500 MG PO TABS
1000.0000 mg | ORAL_TABLET | Freq: Four times a day (QID) | ORAL | Status: DC
  Administered 2020-09-21 – 2020-09-22 (×3): 1000 mg via ORAL
  Filled 2020-09-21 (×3): qty 2

## 2020-09-21 MED ORDER — LIDOCAINE 2% (20 MG/ML) 5 ML SYRINGE
INTRAMUSCULAR | Status: DC | PRN
  Administered 2020-09-21: 1.5 mg/kg/h via INTRAVENOUS

## 2020-09-21 MED ORDER — PROCHLORPERAZINE MALEATE 10 MG PO TABS
10.0000 mg | ORAL_TABLET | Freq: Four times a day (QID) | ORAL | Status: DC | PRN
  Filled 2020-09-21: qty 1

## 2020-09-21 MED ORDER — ENSURE PRE-SURGERY PO LIQD: 592.0000 mL | Freq: Once | ORAL | Status: DC

## 2020-09-21 MED ORDER — SODIUM CHLORIDE 0.9 % IV SOLN: Freq: Three times a day (TID) | INTRAVENOUS | Status: DC | PRN

## 2020-09-21 MED ORDER — LACTATED RINGERS IV SOLN: INTRAVENOUS | Status: AC

## 2020-09-21 MED ORDER — CHLORHEXIDINE GLUCONATE CLOTH 2 % EX PADS: 6.0000 | MEDICATED_PAD | Freq: Once | CUTANEOUS | Status: DC

## 2020-09-21 MED ORDER — AMISULPRIDE (ANTIEMETIC) 5 MG/2ML IV SOLN: 10.0000 mg | Freq: Once | INTRAVENOUS | Status: DC | PRN

## 2020-09-21 MED ORDER — SODIUM CHLORIDE 0.9 % IR SOLN
Status: DC | PRN
  Administered 2020-09-21: 1000 mL

## 2020-09-21 MED ORDER — ROCURONIUM BROMIDE 10 MG/ML (PF) SYRINGE
PREFILLED_SYRINGE | INTRAVENOUS | Status: DC | PRN
  Administered 2020-09-21: 50 mg via INTRAVENOUS
  Administered 2020-09-21 (×2): 20 mg via INTRAVENOUS

## 2020-09-21 MED ORDER — SODIUM CHLORIDE 0.9 % IV SOLN
2.0000 g | INTRAVENOUS | Status: AC
  Administered 2020-09-21: 2 g via INTRAVENOUS
  Filled 2020-09-21: qty 2

## 2020-09-21 MED ORDER — FENTANYL CITRATE (PF) 100 MCG/2ML IJ SOLN
25.0000 ug | INTRAMUSCULAR | Status: DC | PRN
Start: 2020-09-21 — End: 2020-09-21
  Administered 2020-09-21 (×2): 50 ug via INTRAVENOUS

## 2020-09-21 MED ORDER — LIDOCAINE 2% (20 MG/ML) 5 ML SYRINGE
INTRAMUSCULAR | Status: DC | PRN
  Administered 2020-09-21: 60 mg via INTRAVENOUS

## 2020-09-21 MED ORDER — PROPOFOL 10 MG/ML IV BOLUS
INTRAVENOUS | Status: AC
  Filled 2020-09-21: qty 20

## 2020-09-21 MED ORDER — ORAL CARE MOUTH RINSE: 15.0000 mL | Freq: Once | OROMUCOSAL | Status: AC

## 2020-09-21 MED ORDER — ALVIMOPAN 12 MG PO CAPS
12.0000 mg | ORAL_CAPSULE | Freq: Two times a day (BID) | ORAL | Status: DC
  Administered 2020-09-22: 09:00:00 12 mg via ORAL
  Filled 2020-09-21: qty 1

## 2020-09-21 MED ORDER — ONDANSETRON HCL 4 MG/2ML IJ SOLN: 4.0000 mg | Freq: Four times a day (QID) | INTRAMUSCULAR | Status: DC | PRN

## 2020-09-21 MED ORDER — ONDANSETRON HCL 4 MG/2ML IJ SOLN
INTRAMUSCULAR | Status: AC
  Filled 2020-09-21: qty 2

## 2020-09-21 MED ORDER — POLYETHYLENE GLYCOL 3350 17 GM/SCOOP PO POWD: 1.0000 | Freq: Once | ORAL | Status: DC

## 2020-09-21 MED ORDER — BUPIVACAINE LIPOSOME 1.3 % IJ SUSP
INTRAMUSCULAR | Status: DC | PRN
  Administered 2020-09-21: 8 mL

## 2020-09-21 MED ORDER — NEOMYCIN SULFATE 500 MG PO TABS: 1000.0000 mg | ORAL_TABLET | ORAL | Status: DC

## 2020-09-21 MED ORDER — MIDAZOLAM HCL 5 MG/5ML IJ SOLN
INTRAMUSCULAR | Status: DC | PRN
  Administered 2020-09-21: 2 mg via INTRAVENOUS

## 2020-09-21 MED ORDER — KETAMINE HCL 10 MG/ML IJ SOLN
INTRAMUSCULAR | Status: DC | PRN
  Administered 2020-09-21: 30 mg via INTRAVENOUS

## 2020-09-21 MED ORDER — ALUM & MAG HYDROXIDE-SIMETH 200-200-20 MG/5ML PO SUSP: 30.0000 mL | Freq: Four times a day (QID) | ORAL | Status: DC | PRN

## 2020-09-21 MED ORDER — METOPROLOL TARTRATE 5 MG/5ML IV SOLN: 5.0000 mg | Freq: Four times a day (QID) | INTRAVENOUS | Status: DC | PRN

## 2020-09-21 MED ORDER — TRAMADOL HCL 50 MG PO TABS
50.0000 mg | ORAL_TABLET | Freq: Four times a day (QID) | ORAL | Status: DC | PRN
  Filled 2020-09-21: qty 1

## 2020-09-21 MED ORDER — FENTANYL CITRATE (PF) 100 MCG/2ML IJ SOLN
INTRAMUSCULAR | Status: DC | PRN
  Administered 2020-09-21: 50 ug via INTRAVENOUS
  Administered 2020-09-21: 100 ug via INTRAVENOUS

## 2020-09-21 MED ORDER — DIBUCAINE (PERIANAL) 1 % EX OINT
TOPICAL_OINTMENT | CUTANEOUS | Status: AC
  Filled 2020-09-21: qty 28

## 2020-09-21 MED ORDER — ONDANSETRON HCL 4 MG PO TABS: 4.0000 mg | ORAL_TABLET | Freq: Four times a day (QID) | ORAL | Status: DC | PRN

## 2020-09-21 SURGICAL SUPPLY — 48 items
BRIEF STRETCH FOR OB PAD LRG (UNDERPADS AND DIAPERS) ×2 IMPLANT
COVER SURGICAL LIGHT HANDLE (MISCELLANEOUS) ×2 IMPLANT
COVER TIP SHEARS 8 DVNC (MISCELLANEOUS) ×1 IMPLANT
COVER TIP SHEARS 8MM DA VINCI (MISCELLANEOUS) ×2
DRAPE ARM DVNC X/XI (DISPOSABLE) ×4 IMPLANT
DRAPE C-ARM 42X120 X-RAY (DRAPES) IMPLANT
DRAPE COLUMN DVNC XI (DISPOSABLE) ×1 IMPLANT
DRAPE DA VINCI XI ARM (DISPOSABLE) ×8
DRAPE DA VINCI XI COLUMN (DISPOSABLE) ×2
DRAPE ORTHO SPLIT 77X108 STRL (DRAPES) ×2
DRAPE SURG IRRIG POUCH 19X23 (DRAPES) IMPLANT
DRAPE SURG ORHT 6 SPLT 77X108 (DRAPES) ×1 IMPLANT
DRSG PAD ABDOMINAL 8X10 ST (GAUZE/BANDAGES/DRESSINGS) ×2 IMPLANT
ELECT REM PT RETURN 15FT ADLT (MISCELLANEOUS) ×2 IMPLANT
GAUZE SPONGE 4X4 12PLY STRL (GAUZE/BANDAGES/DRESSINGS) ×2 IMPLANT
GLOVE ECLIPSE 8.0 STRL XLNG CF (GLOVE) ×2 IMPLANT
GLOVE INDICATOR 8.0 STRL GRN (GLOVE) ×2 IMPLANT
GOWN STRL REUS W/TWL XL LVL3 (GOWN DISPOSABLE) ×4 IMPLANT
IRRIG SUCT STRYKERFLOW 2 WTIP (MISCELLANEOUS) ×2
IRRIGATION SUCT STRKRFLW 2 WTP (MISCELLANEOUS) ×1 IMPLANT
KIT BASIN OR (CUSTOM PROCEDURE TRAY) ×2 IMPLANT
KIT TURNOVER KIT A (KITS) ×2 IMPLANT
LEGGING LITHOTOMY PAIR STRL (DRAPES) IMPLANT
PAD POSITIONING PINK XL (MISCELLANEOUS) IMPLANT
PLATFORM TRANSANAL ACCESS 4X5 (MISCELLANEOUS) ×1 IMPLANT
PLATFORM TRANSANAL ACCESS 4X5. (MISCELLANEOUS) ×2
SCISSORS LAP 5X35 DISP (ENDOMECHANICALS) IMPLANT
SEAL CANN UNIV 5-8 DVNC XI (MISCELLANEOUS) ×3 IMPLANT
SEAL XI 5MM-8MM UNIVERSAL (MISCELLANEOUS) ×6
SEALER VESSEL DA VINCI XI (MISCELLANEOUS) ×2
SEALER VESSEL EXT DVNC XI (MISCELLANEOUS) ×1 IMPLANT
SET TRI-LUMEN FLTR TB AIRSEAL (TUBING) ×2 IMPLANT
SOLUTION ELECTROLUBE (MISCELLANEOUS) ×2 IMPLANT
STOPCOCK 4 WAY LG BORE MALE ST (IV SETS) ×4 IMPLANT
SURGILUBE 2OZ TUBE FLIPTOP (MISCELLANEOUS) ×2 IMPLANT
SUT PROLENE 0 SH 30 (SUTURE) ×4 IMPLANT
SUT SILK 0 SH 30 (SUTURE) IMPLANT
SUT V-LOC BARB 180 2/0GR6 GS22 (SUTURE) ×4
SUT VLOC BARB 180 ABS3/0GR12 (SUTURE)
SUTURE V-LC BRB 180 2/0GR6GS22 (SUTURE) ×2 IMPLANT
SUTURE VLOC BRB 180 ABS3/0GR12 (SUTURE) IMPLANT
SYR BULB IRRIG 60ML STRL (SYRINGE) ×2 IMPLANT
TOWEL OR 17X26 10 PK STRL BLUE (TOWEL DISPOSABLE) ×2 IMPLANT
TOWEL OR NON WOVEN STRL DISP B (DISPOSABLE) ×2 IMPLANT
TRAY FOLEY MTR SLVR 16FR STAT (SET/KITS/TRAYS/PACK) ×2 IMPLANT
TRAY LAPAROSCOPIC (CUSTOM PROCEDURE TRAY) ×2 IMPLANT
TROCAR ADV FIXATION 5X100MM (TROCAR) IMPLANT
TROCAR PORT AIRSEAL 5X120 (TROCAR) ×2 IMPLANT

## 2020-09-21 NOTE — H&P (Signed)
Aaron Nash  DOB: 1966/07/23   Patient Care Team: Berenice Primas as PCP - General (Nurse Practitioner) Lavonna Monarch, MD as Consulting Physician (Dermatology) Michael Boston, MD as Consulting Physician (Colon and Rectal Surgery) Rogene Houston, MD as Consulting Physician (Gastroenterology)  ` ` Patient sent for surgical consultation at the request of Dr Laural Golden  Chief Complaint: Recurrent rectal polyp. ` ` The patient is a pleasant patient who comes today with his stepmother. Patient was diagnosed with polyps on endoscopy. I large one was found in the proximal rectum. Underwent laparoscopically-assisted/open low anterior resection in 2019 by Dr. Ladona Horns up at [facility] in [place], [place]. Looks like he had a possible focus of intramural cancer. Negative lymph nodes. He recalls getting CT scans done at the time that were underwhelming. Patient had follow-up endoscopy by Dr. Melony Overly in April 2021. Found to have a polyp at the staple line. Resection done with focus of high-grade dysplasia. Had repeat endoscopy done in the fall and found to have recurrence. Biopsies again show high-grade dysplasia. Given recurrence, request made to consider more aggressive transanal resection for better margins.  Patient's records, endoscopies, pathology reports, operative reports reviewed. Patient's insight is somewhat fair. He notes his bowels move about to-3 times a day. No incontinence to flatus or stool. He does not smoke. He can walk at least a half hour without difficulty. Denies any health issues. Not any medications. Not diabetic. No sleep apnea. He recalls his mother was diagnosed with irritable bowel syndrome. He does not know if his sisters have had colonoscopies. He can walk at least a half hour without difficulty. No personal nor family history of GI/colon cancer, inflammatory bowel disease, allergy such as Celiac Sprue, dietary/dairy problems, colitis,  ulcers nor gastritis. No recent sick contacts/gastroenteritis. No travel outside the country. No changes in diet. No dysphagia to solids or liquids. No significant heartburn or reflux. No melena, hematemesis, coffee ground emesis. No evidence of prior gastric/peptic ulceration.  (Review of systems as stated in this history (HPI) or in the review of systems. Otherwise all other 12 point ROS are negative) ` ` ###########################################`  This patient encounter took 60 minutes today to perform the following: obtain history, perform exam, review outside records, interpret tests & imaging, counsel the patient on their diagnosis; and, document this encounter, including findings & plan in the electronic health record (EHR).   Past Surgical History Adin Hector, MD; 07/18/2020 11:29 AM) Colon Polyp Removal - Colonoscopy  Resection of Small Bowel  Colon Removal - Partial  [06/05/2018]: Lap- assisted low anterior rectosigmoid resection. Dr Ladona Horns, [facility] [place], [place], [place]  Diagnostic Studies History Lars Mage [place], [place]; 07/18/2020 9:44 AM) Colonoscopy  within last year  Allergies Lars Mage Spillers, CMA; 07/18/2020 9:44 AM) No Known Drug Allergies  [07/18/2020]:  Medication History (Alisha Spillers, CMA; 07/18/2020 9:44 AM) No Current Medications Medications Reconciled  Social History Illene Regulus, CMA; 07/18/2020 9:44 AM) Alcohol use  Occasional alcohol use. Caffeine use  Carbonated beverages, Coffee, Tea. No drug use  Tobacco use  Former smoker.  Family History Illene Regulus, [place]; 07/18/2020 9:44 AM) Breast Cancer  Sister. Cerebrovascular Accident  Father. Colon Polyps  Father. Diabetes Mellitus  Sister. Hypertension  Father, Mother. Respiratory Condition  Father.  Other Problems Illene Regulus, CMA; 07/18/2020 9:44 AM) Hemorrhoids  Umbilical Hernia Repair     Review of Systems (Alisha Spillers CMA; 07/18/2020 9:44  AM) General Not Present- Appetite Loss, Chills, Fatigue, Fever, Night Sweats, Weight Gain and Weight Loss. Skin  Not Present- Change in Wart/Mole, Dryness, Hives, Jaundice, New Lesions, Non-Healing Wounds, Rash and Ulcer. HEENT Present- Seasonal Allergies and Wears glasses/contact lenses. Not Present- Earache, Hearing Loss, Hoarseness, Nose Bleed, Oral Ulcers, Ringing in the Ears, Sinus Pain, Sore Throat, Visual Disturbances and Yellow Eyes. Breast Not Present- Breast Mass, Breast Pain, Nipple Discharge and Skin Changes. Cardiovascular Not Present- Chest Pain, Difficulty Breathing Lying Down, Leg Cramps, Palpitations, Rapid Heart Rate, Shortness of Breath and Swelling of Extremities. Gastrointestinal Present- Hemorrhoids. Not Present- Abdominal Pain, Bloating, Bloody Stool, Change in Bowel Habits, Chronic diarrhea, Constipation, Difficulty Swallowing, Excessive gas, Gets full quickly at meals, Indigestion, Nausea, Rectal Pain and Vomiting. Male Genitourinary Not Present- Blood in Urine, Change in Urinary Stream, Frequency, Impotence, Nocturia, Painful Urination, Urgency and Urine Leakage. Musculoskeletal Not Present- Back Pain, Joint Pain, Joint Stiffness, Muscle Pain, Muscle Weakness and Swelling of Extremities. Neurological Not Present- Decreased Memory, Fainting, Headaches, Numbness, Seizures, Tingling, Tremor, Trouble walking and Weakness. Psychiatric Not Present- Anxiety, Bipolar, Change in Sleep Pattern, Depression, Fearful and Frequent crying. Endocrine Not Present- Cold Intolerance, Excessive Hunger, Hair Changes, Heat Intolerance, Hot flashes and New Diabetes. Hematology Not Present- Blood Thinners, Easy Bruising, Excessive bleeding, Gland problems, HIV and Persistent Infections.  Vitals (Alisha Spillers CMA; 07/18/2020 9:44 AM) 07/18/2020 9:44 AM Weight: 174 lb Height: 69in Body Surface Area: 1.95 m Body Mass Index: 25.7 kg/m  Pulse: 91 (Regular)  BP: 118/62(Sitting, Left Arm,  Standard)       Physical Exam Adin Hector MD; 07/18/2020 10:03 AM) General Mental Status-Alert. General Appearance-Not in acute distress, Not Sickly. Orientation-Oriented X3. Hydration-Well hydrated. Voice-Normal.  Integumentary Global Assessment Upon inspection and palpation of skin surfaces of the - Axillae: non-tender, no inflammation or ulceration, no drainage. and Distribution of scalp and body hair is normal. General Characteristics Temperature - normal warmth is noted.  Head and Neck Head-normocephalic, atraumatic with no lesions or palpable masses. Face Global Assessment - atraumatic, no absence of expression. Neck Global Assessment - no abnormal movements, no bruit auscultated on the right, no bruit auscultated on the left, no decreased range of motion, non-tender. Trachea-midline. Thyroid Gland Characteristics - non-tender.  Eye Eyeball - Left-Extraocular movements intact, No Nystagmus - Left. Eyeball - Right-Extraocular movements intact, No Nystagmus - Right. Cornea - Left-No Hazy - Left. Cornea - Right-No Hazy - Right. Sclera/Conjunctiva - Left-No scleral icterus, No Discharge - Left. Sclera/Conjunctiva - Right-No scleral icterus, No Discharge - Right. Pupil - Left-Direct reaction to light normal. Pupil - Right-Direct reaction to light normal. Note: Wears glasses. Vision corrected   ENMT Ears Pinna - Left - no drainage observed, no generalized tenderness observed. Pinna - Right - no drainage observed, no generalized tenderness observed. Nose and Sinuses External Inspection of the Nose - no destructive lesion observed. Inspection of the nares - Left - quiet respiration. Inspection of the nares - Right - quiet respiration. Mouth and Throat Lips - Upper Lip - no fissures observed, no pallor noted. Lower Lip - no fissures observed, no pallor noted. Nasopharynx - no discharge present. Oral Cavity/Oropharynx - Tongue - no  dryness observed. Oral Mucosa - no cyanosis observed. Hypopharynx - no evidence of airway distress observed.  Chest and Lung Exam Inspection Movements - Normal and Symmetrical. Accessory muscles - No use of accessory muscles in breathing. Palpation Palpation of the chest reveals - Non-tender. Auscultation Breath sounds - Normal and Clear.  Cardiovascular Auscultation Rhythm - Regular. Murmurs & Other Heart Sounds - Auscultation of the heart reveals - No Murmurs  and No Systolic Clicks.  Abdomen Inspection Inspection of the abdomen reveals - No Visible peristalsis and No Abnormal pulsations. Umbilicus - No Bleeding, No Urine drainage. Palpation/Percussion Palpation and Percussion of the abdomen reveal - Soft, Non Tender, No Rebound tenderness, No Rigidity (guarding) and No Cutaneous hyperesthesia. Note: Abdomen soft. Nontender. Not distended. Midline incision intact. Supraumbilical diastases recti. No obvious incisional hernia. No umbilical or incisional hernias. No guarding.   Male Genitourinary Sexual Maturity Tanner 5 - Adult hair pattern and Adult penile size and shape. Note: No inguinal hernias. Normal external genitalia. Epididymi, testes, and spermatic cords normal without any masses.   Rectal Note: Perianal skin clear. Mild anterior hemorrhoidal fall that midline raphae. Normal sphincter tone. Soft stool in vault.  At the tip of my finger around 8 cm from the anal verge in the posterior midline there is a soft mobile rectal polyp with some central cratering. Feels posterior midline involving about a quarter of the circumference. I cannot feel the top edge.   Peripheral Vascular Upper Extremity Inspection - Left - No Cyanotic nailbeds - Left, Not Ischemic. Inspection - Right - No Cyanotic nailbeds - Right, Not Ischemic.  Neurologic Neurologic evaluation reveals -normal attention span and ability to concentrate, able to name objects and repeat phrases. Appropriate  fund of knowledge , normal sensation and normal coordination. Mental Status Affect - not angry, not paranoid. Cranial Nerves-Normal Bilaterally. Gait-Normal.  Neuropsychiatric Mental status exam performed with findings of-able to articulate well with normal speech/language, rate, volume and coherence, thought content normal with ability to perform basic computations and apply abstract reasoning and no evidence of hallucinations, delusions, obsessions or homicidal/suicidal ideation.  Musculoskeletal Global Assessment Spine, Ribs and Pelvis - no instability, subluxation or laxity. Right Upper Extremity - no instability, subluxation or laxity.  Lymphatic Head & Neck  General Head & Neck Lymphatics: Bilateral - Description - No Localized lymphadenopathy. Axillary  General Axillary Region: Bilateral - Description - No Localized lymphadenopathy. Femoral & Inguinal  Generalized Femoral & Inguinal Lymphatics: Left - Description - No Localized lymphadenopathy. Right - Description - No Localized lymphadenopathy.    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  History of rectosigmoid polyps status post low anterior resection 2019. 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 Patient states he had negative CT scans at that time. 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  History of rectosigmoid polyps status post low anterior resection 2019. 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 Patient states he had negative CT scans at that time.  Review the original operative report as well as all the endoscopies and pathology  reports. Discuss with my colorectal partner, Dr. Leighton Ruff.  Because is no evidence of any cancer at this time and he is more than 2 years from his original surgery, reasonable to try a more aggressive local excision. Transanal approach. Most likely robotic TAMIS full-thickness excision with immediate closure. There is risk of intraperitoneal breech since this recurrent seems to be at the staple line of the prior low anterior resection. Hopefully can minimize those risks. Low threshold to consider diagnostic laparoscopy & airleak test if needed. This does not seem to be large. Posterior midline positioning remains most likely can do in low lithotomy. Backup plan would  be conversion to a minimally invasive repeat low anterior resection. Hopefully not too likely at this point.  I had long discussion with the patient and his stepmother. His insight seems to be fair and he wishes to defer to his stepmother. She asked many appropriate questions. Risks benefits alternatives discussed. They agreed to try more aggressive local transanal excision through TEM/TAMIS first  Should there be evidence of cancer within the final pathology, strongly leaning towards robotic redo low anterior resection for better margins. May need temporary diverting loop ileostomy but I think there is enough room that we can avoid that. We would have to see. If the patient has recurrence after TAMIS in the future, robotic redo low anterior resection. Hopefully we can avoid that chance. We will see.  Prior CT scans note small liver lesions - repeat Current Plans Pt Education - [facility] TEM Education (Shary Lamos): discussed with patient and provided information. Pt Education - Polyps in the Colon and Rectum (Colonic and Rectal Polyps): colonic polyps PREOP COLON - ENCOUNTER FOR PREOPERATIVE EXAMINATION FOR GENERAL SURGICAL PROCEDURE (Z01.818) Current Plans You are being scheduled for surgery- Our schedulers will call you.  You should hear  from our office's scheduling department within 5 working days about the location, date, and time of surgery. We try to make accommodations for patient's preferences in scheduling surgery, but sometimes the OR schedule or the surgeon's schedule prevents [place] from making those accommodations.  If you have not heard from our office [phone]) in 5 working days, call the office and ask for your surgeon's nurse.  If you have other questions about your diagnosis, plan, or surgery, call the office and ask for your surgeon's nurse.  The anatomy & physiology of the digestive tract was discussed. The pathophysiology of the rectal pathology was discussed. Natural history risks without surgery was discussed. I feel the risks of no intervention will lead to serious problems that outweigh the operative risks; therefore, I recommended surgery.  Laparoscopic & open abdominal techniques were discussed. I recommended we start with a partial proctectomy by transanal endoscopic microsurgery (TEM) for excisional biopsy to remove the pathology and hopefully cure and/or control the pathology. This technique can offer less operative risk and faster post-operative recovery. Possible need for immediate or later abdominal surgery for further treatment was discussed.  Risks such as bleeding, abscess, reoperation, ostomy, heart attack, death, and other risks were discussed. I noted a good likelihood this will help address the problem. Goals of post-operative recovery were discussed as well. We will work to minimize complications. An educational handout was given as well. Questions were answered. The patient expresses understanding & wishes to proceed with surgery.  Written instructions provided Pt Education - [facility] Colon Bowel Prep 2018 ERAS/Miralax/Antibiotics Started Neomycin Sulfate 500 MG Oral Tablet, 2 (two) Tablet SEE NOTE, #6, 07/18/2020, No Refill. Local Order: Pharmacist Notes: TAKE TWO TABLETS AT 2 PM,  3 PM, AND 10 PM THE DAY PRIOR TO SURGERY Started metroNIDAZOLE 500 MG Oral Tablet, 2 (two) Tablet three times daily, #6, 1 day starting 07/18/2020, No Refill. Local Order: Pharmacist Notes: Pharmacy Instructions: Take 2 tablets at 2pm, 3pm, and 10pm the day prior to your colon operation. Pt Education - [facility] Colectomy post-op instructions: discussed with patient and provided information. MASS OF MULTIPLE SITES OF LIVER (R16.0) Impression: We were able to get a copy of the CAT scan from 2019 done at [facility]. Concern for a few small liver lesions. Discussion about MRI versus repeat CT. I don't think  anything else has been done, so we will start with a CT scan of the abdomen to make sure the lesions are stable. If they are larger, may need to regroup w MRI +/- and biopsy to rule out metastatic disease. Hopefully not too likely. We will see.     Adin Hector, MD, FACS, MASCRS Gastrointestinal and Minimally Invasive Surgery  [facility] [address], [place], [place] [address] [phone] Fax [phone] Main/Paging  CONTACT INFORMATION: Weekday (9AM-5PM) concerns: Call [facility] main office at [phone] Weeknight (5PM-9AM) or Weekend/Holiday concerns: Check [email or web address] for General Surgery [facility] coverage (Please, do not use SecureChat as it is not reliable communication to operating surgeons for immediate patient care)       F/U CT abd with small liver masses unchanged in >1 year = c/w cysts

## 2020-09-21 NOTE — Discharge Instructions (Signed)
SURGERY: POST OP INSTRUCTIONS (Surgery for small bowel obstruction, colon resection, etc)   ######################################################################  EAT Gradually transition to a high fiber diet with a fiber supplement over the next few days after discharge  WALK Walk an hour a day.  Control your pain to do that.    CONTROL PAIN Control pain so that you can walk, sleep, tolerate sneezing/coughing, go up/down stairs.  HAVE A BOWEL MOVEMENT DAILY Keep your bowels regular to avoid problems.  OK to try a laxative to override constipation.  OK to use an antidairrheal to slow down diarrhea.  Call if not better after 2 tries  CALL IF YOU HAVE PROBLEMS/CONCERNS Call if you are still struggling despite following these instructions. Call if you have concerns not answered by these instructions  ######################################################################   DIET Follow a light diet the first few days at home.  Start with a bland diet such as soups, liquids, starchy foods, low fat foods, etc.  If you feel full, bloated, or constipated, stay on a ful liquid or pureed/blenderized diet for a few days until you feel better and no longer constipated. Be sure to drink plenty of fluids every day to avoid getting dehydrated (feeling dizzy, not urinating, etc.). Gradually add a fiber supplement to your diet over the next week.  Gradually get back to a regular solid diet.  Avoid fast food or heavy meals the first week as you are more likely to get nauseated. It is expected for your digestive tract to need a few months to get back to normal.  It is common for your bowel movements and stools to be irregular.  You will have occasional bloating and cramping that should eventually fade away.  Until you are eating solid food normally, off all pain medications, and back to regular activities; your bowels will not be normal. Focus on eating a low-fat, high fiber diet the rest of your life  (See Getting to Good Bowel Health, below).  CARE of your INCISION or WOUND It is good for closed incision and even open wounds to be washed every day.  Shower every day.  Short baths are fine.  Wash the incisions and wounds clean with soap & water.    If you have a closed incision(s), wash the incision with soap & water every day.  You may leave closed incisions open to air if it is dry.   You may cover the incision with clean gauze & replace it after your daily shower for comfort. If you have skin tapes (Steristrips) or skin glue (Dermabond) on your incision, leave them in place.  They will fall off on their own like a scab.  You may trim any edges that curl up with clean scissors.  If you have staples, set up an appointment for them to be removed in the office in 10 days after surgery.  If you have a drain, wash around the skin exit site with soap & water and place a new dressing of gauze or band aid around the skin every day.  Keep the drain site clean & dry.    If you have an open wound with packing, see wound care instructions.  In general, it is encouraged that you remove your dressing and packing, shower with soap & water, and replace your dressing once a day.  Pack the wound with clean gauze moistened with normal (0.9%) saline to keep the wound moist & uninfected.  Pressure on the dressing for 30 minutes will stop most wound   bleeding.  Eventually your body will heal & pull the open wound closed over the next few months.  Raw open wounds will occasionally bleed or secrete yellow drainage until it heals closed.  Drain sites will drain a little until the drain is removed.  Even closed incisions can have mild bleeding or drainage the first few days until the skin edges scab over & seal.   If you have an open wound with a wound vac, see wound vac care instructions.     ACTIVITIES as tolerated Start light daily activities --- self-care, walking, climbing stairs-- beginning the day after surgery.   Gradually increase activities as tolerated.  Control your pain to be active.  Stop when you are tired.  Ideally, walk several times a day, eventually an hour a day.   Most people are back to most day-to-day activities in a few weeks.  It takes 4-8 weeks to get back to unrestricted, intense activity. If you can walk 30 minutes without difficulty, it is safe to try more intense activity such as jogging, treadmill, bicycling, low-impact aerobics, swimming, etc. Save the most intensive and strenuous activity for last (Usually 4-8 weeks after surgery) such as sit-ups, heavy lifting, contact sports, etc.  Refrain from any intense heavy lifting or straining until you are off narcotics for pain control.  You will have off days, but things should improve week-by-week. DO NOT PUSH THROUGH PAIN.  Let pain be your guide: If it hurts to do something, don't do it.  Pain is your body warning you to avoid that activity for another week until the pain goes down. You may drive when you are no longer taking narcotic prescription pain medication, you can comfortably wear a seatbelt, and you can safely make sudden turns/stops to protect yourself without hesitating due to pain. You may have sexual intercourse when it is comfortable. If it hurts to do something, stop.  MEDICATIONS Take your usually prescribed home medications unless otherwise directed.   Blood thinners:  Usually you can restart any strong blood thinners after the second postoperative day.  It is OK to take aspirin right away.     If you are on strong blood thinners (warfarin/Coumadin, Plavix, Xerelto, Eliquis, Pradaxa, etc), discuss with your surgeon, medicine PCP, and/or cardiologist for instructions on when to restart the blood thinner & if blood monitoring is needed (PT/INR blood check, etc).     PAIN CONTROL Pain after surgery or related to activity is often due to strain/injury to muscle, tendon, nerves and/or incisions.  This pain is usually  short-term and will improve in a few months.  To help speed the process of healing and to get back to regular activity more quickly, DO THE FOLLOWING THINGS TOGETHER: 1. Increase activity gradually.  DO NOT PUSH THROUGH PAIN 2. Use Ice and/or Heat 3. Try Gentle Massage and/or Stretching 4. Take over the counter pain medication 5. Take Narcotic prescription pain medication for more severe pain  Good pain control = faster recovery.  It is better to take more medicine to be more active than to stay in bed all day to avoid medications. 1.  Increase activity gradually Avoid heavy lifting at first, then increase to lifting as tolerated over the next 6 weeks. Do not "push through" the pain.  Listen to your body and avoid positions and maneuvers than reproduce the pain.  Wait a few days before trying something more intense Walking an hour a day is encouraged to help your body recover faster   and more safely.  Start slowly and stop when getting sore.  If you can walk 30 minutes without stopping or pain, you can try more intense activity (running, jogging, aerobics, cycling, swimming, treadmill, sex, sports, weightlifting, etc.) Remember: If it hurts to do it, then don't do it! 2. Use Ice and/or Heat You will have swelling and bruising around the incisions.  This will take several weeks to resolve. Ice packs or heating pads (6-8 times a day, 30-60 minutes at a time) will help sooth soreness & bruising. Some people prefer to use ice alone, heat alone, or alternate between ice & heat.  Experiment and see what works best for you.  Consider trying ice for the first few days to help decrease swelling and bruising; then, switch to heat to help relax sore spots and speed recovery. Shower every day.  Short baths are fine.  It feels good!  Keep the incisions and wounds clean with soap & water.   3. Try Gentle Massage and/or Stretching Massage at the area of pain many times a day Stop if you feel pain - do not  overdo it 4. Take over the counter pain medication This helps the muscle and nerve tissues become less irritable and calm down faster Choose ONE of the following over-the-counter anti-inflammatory medications: Acetaminophen 500mg tabs (Tylenol) 1-2 pills with every meal and just before bedtime (avoid if you have liver problems or if you have acetaminophen in you narcotic prescription) Naproxen 220mg tabs (ex. Aleve, Naprosyn) 1-2 pills twice a day (avoid if you have kidney, stomach, IBD, or bleeding problems) Ibuprofen 200mg tabs (ex. Advil, Motrin) 3-4 pills with every meal and just before bedtime (avoid if you have kidney, stomach, IBD, or bleeding problems) Take with food/snack several times a day as directed for at least 2 weeks to help keep pain / soreness down & more manageable. 5. Take Narcotic prescription pain medication for more severe pain A prescription for strong pain control is often given to you upon discharge (for example: oxycodone/Percocet, hydrocodone/Norco/Vicodin, or tramadol/Ultram) Take your pain medication as prescribed. Be mindful that most narcotic prescriptions contain Tylenol (acetaminophen) as well - avoid taking too much Tylenol. If you are having problems/concerns with the prescription medicine (does not control pain, nausea, vomiting, rash, itching, etc.), please call us [phone] to see if we need to switch you to a different pain medicine that will work better for you and/or control your side effects better. If you need a refill on your pain medication, you must call the office before 4 pm and on weekdays only.  By federal law, prescriptions for narcotics cannot be called into a pharmacy.  They must be filled out on paper & picked up from our office by the patient or authorized caretaker.  Prescriptions cannot be filled after 4 pm nor on weekends.    WHEN TO CALL US [phone] Severe uncontrolled or worsening pain  Fever over 101 F (38.5 C) Concerns with  the incision: Worsening pain, redness, rash/hives, swelling, bleeding, or drainage Reactions / problems with new medications (itching, rash, hives, nausea, etc.) Nausea and/or vomiting Difficulty urinating Difficulty breathing Worsening fatigue, dizziness, lightheadedness, blurred vision Other concerns If you are not getting better after two weeks or are noticing you are getting worse, contact our office [phone] for further advice.  We may need to adjust your medications, re-evaluate you in the office, send you to the emergency room, or see what other things we can do to help. The   clinic staff is available to answer your questions during regular business hours (8:30am-5pm).  Please don't hesitate to call and ask to speak to one of our nurses for clinical concerns.    A surgeon from [facility] is always on call at the hospitals 24 hours/day If you have a medical emergency, go to the nearest emergency room or call 911.  FOLLOW UP in our office One the day of your discharge from the hospital (or the next business weekday), please call [facility] to set up or confirm an appointment to see your surgeon in the office for a follow-up appointment.  Usually it is 2-3 weeks after your surgery.   If you have skin staples at your incision(s), let the office know so we can set up a time in the office for the nurse to remove them (usually around 10 days after surgery). Make sure that you call for appointments the day of discharge (or the next business weekday) from the hospital to ensure a convenient appointment time. IF YOU HAVE DISABILITY OR FAMILY LEAVE FORMS, BRING THEM TO THE OFFICE FOR PROCESSING.  DO NOT GIVE THEM TO YOUR DOCTOR.  [facility] [address], [address], , [place]  [address] ? [phone] - Main [phone] - Toll Free,  [phone] - Fax [email or web address]  GETTING TO GOOD BOWEL HEALTH. It is  expected for your digestive tract to need a few months to get back to normal.  It is common for your bowel movements and stools to be irregular.  You will have occasional bloating and cramping that should eventually fade away.  Until you are eating solid food normally, off all pain medications, and back to regular activities; your bowels will not be normal.   Avoiding constipation The goal: ONE SOFT BOWEL MOVEMENT A DAY!    Drink plenty of fluids.  Choose water first. TAKE A FIBER SUPPLEMENT EVERY DAY THE REST OF YOUR LIFE During your first week back home, gradually add back a fiber supplement every day Experiment which form you can tolerate.   There are many forms such as powders, tablets, wafers, gummies, etc Psyllium bran (Metamucil), methylcellulose (Citrucel), Miralax or Glycolax, Benefiber, Flax Seed.  Adjust the dose week-by-week (1/2 dose/day to 6 doses a day) until you are moving your bowels 1-2 times a day.  Cut back the dose or try a different fiber product if it is giving you problems such as diarrhea or bloating. Sometimes a laxative is needed to help jump-start bowels if constipated until the fiber supplement can help regulate your bowels.  If you are tolerating eating & you are farting, it is okay to try a gentle laxative such as double dose MiraLax, prune juice, or Milk of Magnesia.  Avoid using laxatives too often. Stool softeners can sometimes help counteract the constipating effects of narcotic pain medicines.  It can also cause diarrhea, so avoid using for too long. If you are still constipated despite taking fiber daily, eating solids, and a few doses of laxatives, call our office. Controlling diarrhea Try drinking liquids and eating bland foods for a few days to avoid stressing your intestines further. Avoid dairy products (especially milk & ice cream) for a short time.  The intestines often can lose the ability to digest lactose when stressed. Avoid foods that cause gassiness or  bloating.  Typical foods include beans and other legumes, cabbage, broccoli, and dairy foods.  Avoid greasy, spicy, fast foods.  Every person has   some sensitivity to other foods, so listen to your body and avoid those foods that trigger problems for you. Probiotics (such as active yogurt, Align, etc) may help repopulate the intestines and colon with normal bacteria and calm down a sensitive digestive tract Adding a fiber supplement gradually can help thicken stools by absorbing excess fluid and retrain the intestines to act more normally.  Slowly increase the dose over a few weeks.  Too much fiber too soon can backfire and cause cramping & bloating. It is okay to try and slow down diarrhea with a few doses of antidiarrheal medicines.   Bismuth subsalicylate (ex. Kayopectate, Pepto Bismol) for a few doses can help control diarrhea.  Avoid if pregnant.   Loperamide (Imodium) can slow down diarrhea.  Start with one tablet (2mg) first.  Avoid if you are having fevers or severe pain.  ILEOSTOMY PATIENTS WILL HAVE CHRONIC DIARRHEA since their colon is not in use.    Drink plenty of liquids.  You will need to drink even more glasses of water/liquid a day to avoid getting dehydrated. Record output from your ileostomy.  Expect to empty the bag every 3-4 hours at first.  Most people with a permanent ileostomy empty their bag 4-6 times at the least.   Use antidiarrheal medicine (especially Imodium) several times a day to avoid getting dehydrated.  Start with a dose at bedtime & breakfast.  Adjust up or down as needed.  Increase antidiarrheal medications as directed to avoid emptying the bag more than 8 times a day (every 3 hours). Work with your wound ostomy nurse to learn care for your ostomy.  See ostomy care instructions. TROUBLESHOOTING IRREGULAR BOWELS 1) Start with a soft & bland diet. No spicy, greasy, or fried foods.  2) Avoid gluten/wheat or dairy products from diet to see if symptoms improve. 3) Miralax  17gm or flax seed mixed in 8oz. water or juice-daily. May use 2-4 times a day as needed. 4) Gas-X, Phazyme, etc. as needed for gas & bloating.  5) Prilosec (omeprazole) over-the-counter as needed 6)  Consider probiotics (Align, Activa, etc) to help calm the bowels down  Call your doctor if you are getting worse or not getting better.  Sometimes further testing (cultures, endoscopy, X-ray studies, CT scans, bloodwork, etc.) may be needed to help diagnose and treat the cause of the diarrhea. [facility] [address], [address], [place], [place]  [address] [phone] - Main.    [phone]  - Toll Free.   [phone] - Fax [email or web address]    Colon Polyps  Colon polyps are tissue growths inside the colon, which is part of the large intestine. They are one of the types of polyps that can grow in the body. A polyp may be a round bump or a mushroom-shaped growth. You could have one polyp or more than one. Most colon polyps are noncancerous (benign). However, some colon polyps can become cancerous over time. Finding and removing the polyps early can help prevent this. What are the causes? The exact cause of colon polyps is not known. What increases the risk? The following factors may make you more likely to develop this condition:  Having a family history of colorectal cancer or colon polyps.  Being older than 56 years of age.  Being younger than 56 years of age and having a significant family history of colorectal cancer or colon polyps or a genetic condition that puts you at higher risk of getting colon polyps.    Having inflammatory bowel disease, such as ulcerative colitis or Crohn's disease.  Having certain conditions passed from parent to child (hereditary conditions), such as: ? Familial adenomatous polyposis (FAP). ? Lynch syndrome. ? Turcot syndrome. ? Peutz-Jeghers syndrome. ? MUTYH-associated polyposis (MAP).  Being  overweight.  Certain lifestyle factors. These include smoking cigarettes, drinking too much alcohol, not getting enough exercise, and eating a diet that is high in fat and red meat and low in fiber.  Having had childhood cancer that was treated with radiation of the abdomen. What are the signs or symptoms? Many times, there are no symptoms. If you have symptoms, they may include:  Blood coming from the rectum during a bowel movement.  Blood in the stool (feces). The blood may be bright red or very dark in color.  Pain in the abdomen.  A change in bowel habits, such as constipation or diarrhea. How is this diagnosed? This condition is diagnosed with a colonoscopy. This is a procedure in which a lighted, flexible scope is inserted into the opening between the buttocks (anus) and then passed into the colon to examine the area. Polyps are sometimes found when a colonoscopy is done as part of routine cancer screening tests. How is this treated? This condition is treated by removing any polyps that are found. Most polyps can be removed during a colonoscopy. Those polyps will then be tested for cancer. Additional treatment may be needed depending on the results of testing. Follow these instructions at home: Eating and drinking  Eat foods that are high in fiber, such as fruits, vegetables, and whole grains.  Eat foods that are high in calcium and vitamin D, such as milk, cheese, yogurt, eggs, liver, fish, and broccoli.  Limit foods that are high in fat, such as fried foods and desserts.  Limit the amount of red meat, precooked or cured meat, or other processed meat that you eat, such as hot dogs, sausages, bacon, or meat loaves.  Limit sugary drinks.   Lifestyle  Maintain a healthy weight, or lose weight if recommended by your health care provider.  Exercise every day or as told by your health care provider.  Do not use any products that contain nicotine or tobacco, such as cigarettes,  e-cigarettes, and chewing tobacco. If you need help quitting, ask your health care provider.  Do not drink alcohol if: ? Your health care provider tells you not to drink. ? You are pregnant, may be pregnant, or are planning to become pregnant.  If you drink alcohol: ? Limit how much you use to:  0-1 drink a day for women.  0-2 drinks a day for men. ? Know how much alcohol is in your drink. In the [place] one drink equals one 12 oz bottle of beer (355 mL), one 5 oz glass of wine (148 mL), or one 1 oz glass of hard liquor (44 mL). General instructions  Take over-the-counter and prescription medicines only as told by your health care provider.  Keep all follow-up visits. This is important. This includes having regularly scheduled colonoscopies. Talk to your health care provider about when you need a colonoscopy. Contact a health care provider if:  You have new or worsening bleeding during a bowel movement.  You have new or increased blood in your stool.  You have a change in bowel habits.  You lose weight for no known reason. Summary  Colon polyps are tissue growths inside the colon, which is part of the large intestine. They are one   type of polyp that can grow in the body.  Most colon polyps are noncancerous (benign), but some can become cancerous over time.  This condition is diagnosed with a colonoscopy.  This condition is treated by removing any polyps that are found. Most polyps can be removed during a colonoscopy. This information is not intended to replace advice given to you by your health care provider. Make sure you discuss any questions you have with your health care provider. Document Revised: 11/11/2019 Document Reviewed: 11/11/2019 Elsevier Patient Education  2021 [employer]  

## 2020-09-21 NOTE — Transfer of Care (Signed)
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  Last Pain:  Vitals:   09/21/20 0500  TempSrc: Oral         Complications: No complications documented.

## 2020-09-21 NOTE — Interval H&P Note (Signed)
History and Physical Interval Note:  09/21/2020 7:22 AM  Aaron Nash  has presented today for surgery, with the diagnosis of RECURRENT RECTAL POLYP.  The various methods of treatment have been discussed with the patient and family. After consideration of risks, benefits and other options for treatment, the patient has consented to  Procedure(s): ROBOTIC TAMIS PARTIAL PROCTECTOMY OF RECURRENT RECTAL MASS (N/A) as a surgical intervention.  The patient's history has been reviewed, patient examined, no change in status, stable for surgery.  I have reviewed the patient's chart and labs.  Questions were answered to the patient's satisfaction.     I have re-reviewed the the patient's records, history, medications, and allergies.  I have re-examined the patient.  I again discussed intraoperative plans and goals of post-operative recovery.  The patient agrees to proceed.  Aaron Nash  [birth year] [id number]  Patient Care Team: Berenice Primas as PCP - General (Nurse Practitioner) Lavonna Monarch, MD as Consulting Physician (Dermatology) Michael Boston, MD as Consulting Physician (Colon and Rectal Surgery) Rogene Houston, MD as Consulting Physician (Gastroenterology)  Patient Active Problem List   Diagnosis Date Noted   COVID-19 virus infection 08/2020   History of colon cancer 10/27/2019   IBS (irritable bowel syndrome) 10/27/2019   Colon cancer ([place]) 2019    Past Medical History:  Diagnosis Date   Atypical mole 01/08/2019   mild atypia on left upper back (free)   Atypical mole 01/08/2019   mild atypia on lower right back (free)   Basal cell carcinoma 12/12/2017   pigmented on right forehead (MOHs)   Cancer ([place])    skin cancer, basal cell   Chronic diarrhea    Colon cancer ([place]) 2019   COVID-19 virus infection 03/2020   COVID-19 virus infection 08/2020   History of colonic polyps 06/2018   Patient states that there was numerous polyps and told one had a cancer seed in  it.   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    Past Surgical History:  Procedure Laterality Date   COLON SURGERY     COLONOSCOPY  06/2018   Dr.Cathey in [place]   COLONOSCOPY N/A 11/11/2019   Procedure: COLONOSCOPY;  Surgeon: Rogene Houston, MD;  [facility];  Service: Endoscopy;  Laterality: N/A;  [place] N/A 03/31/2020   Procedure: FLEXIBLE SIGMOIDOSCOPY;  Surgeon: Rogene Houston, MD;  [facility];  Service: Endoscopy;  Laterality: N/A;  200, per office pt knows new arrival time 6:30   FLEXIBLE SIGMOIDOSCOPY N/A 06/09/2020   Procedure: FLEXIBLE SIGMOIDOSCOPY;  Surgeon: Rogene Houston, MD;  [facility];  Service: Endoscopy;  Laterality: N/A;  730,moved to 11/4 @ 1:00, pt test +on 8/31, does not need covid test < 90 days   FRACTURE SURGERY Left    leg - injury as a child   POLYPECTOMY  11/11/2019   Procedure: POLYPECTOMY;  Surgeon: Rogene Houston, MD;  [facility];  Service: Endoscopy;;  Hepatic and ascending colon polyp biopsy forcep,  rectal polyp distal to anastamosis, large rectal polyp (piecemeal) hot snare.   POLYPECTOMY  06/09/2020   Procedure: POLYPECTOMY;  Surgeon: Rogene Houston, MD;  [facility];  Service: Endoscopy;;    Social History   Socioeconomic History   Marital status: Single    Spouse name: Not on file   Number of children: Not on file  Years of education: Not on file   Highest education level: Not on file  Occupational History   Not on file  Tobacco Use   Smoking status: Former Smoker    Types: Cigarettes    Quit date: 10/27/2014    Years since quitting: 5.9   Smokeless tobacco: Never Used  Vaping Use   Vaping Use: Never used  Substance and Sexual Activity   Alcohol use: Yes    Comment: Per patient rarely   Drug use: Never   Sexual activity: Yes  Other Topics Concern   Not on file  Social  History Narrative   Not on file   Social Determinants of Health   Financial Resource Strain: Not on file  Food Insecurity: Not on file  Transportation Needs: Not on file  Physical Activity: Not on file  Stress: Not on file  Social Connections: Not on file  Intimate Partner Violence: Not on file    Family History  Problem Relation Age of Onset   Osteoporosis Mother    Hypertension Father    Healthy Sister     Medications Prior to Admission  Medication Sig Dispense Refill Last Dose   sildenafil (REVATIO) 20 MG tablet Take 60 mg by mouth daily as needed for erectile dysfunction.   Past Week at Unknown time   metroNIDAZOLE (FLAGYL) 500 MG tablet Take 1,000 mg by mouth in the morning, at noon, and at bedtime. (Patient not taking: Reported on 09/13/2020)   Completed Course at Unknown time   neomycin (MYCIFRADIN) 500 MG tablet Take 1,000 mg by mouth 3 (three) times daily. (Patient not taking: Reported on 09/13/2020)   Completed Course at Unknown time    Current Facility-Administered Medications  Medication Dose Route Frequency Provider Last Rate Last Admin   bupivacaine liposome (EXPAREL) 1.3 % injection 266 mg  20 mL Infiltration On Call to OR Michael Boston, MD       cefoTEtan (CEFOTAN) 2 g in sodium chloride 0.9 % 100 mL IVPB  2 g Intravenous On Call to OR Michael Boston, MD       Chlorhexidine Gluconate Cloth 2 % PADS 6 each  6 each Topical Once Michael Boston, MD       clindamycin (CLEOCIN) 900 mg, gentamicin (GARAMYCIN) 240 mg in sodium chloride 0.9 % 1,000 mL for intraperitoneal lavage   Irrigation On Call to OR Michael Boston, MD       enoxaparin (LOVENOX) injection 40 mg  40 mg Subcutaneous Once Michael Boston, MD       feeding supplement (ENSURE PRE-SURGERY) liquid 296 mL  296 mL Oral Once Michael Boston, MD       lactated ringers infusion   Intravenous Continuous Freddrick March, MD 10 mL/hr at 09/21/20 0716 New Bag at 09/21/20 0716     No Known Allergies  BP 111/73   Pulse 76    Temp 97.7 F (36.5 C) (Oral)   Resp 16   Ht 5\' 9"  (1.753 m)   Wt 77.1 kg   SpO2 98%   BMI 25.10 kg/m   Labs: No results found for this or any previous visit (from the past 48 hour(s)).  Imaging / Studies: No results found.   Adin Hector, M.D., F.A.C.S. Gastrointestinal and Minimally Invasive Surgery [facility] [address], [place], [place] [address] [phone] Main / Paging  09/21/2020 7:22 AM    Adin Hector

## 2020-09-21 NOTE — Anesthesia Postprocedure Evaluation (Signed)
Anesthesia Post Note  Patient: Aaron Nash  Procedure(s) Performed: RECURRENT RECTAL POLYP PARTIAL PROCTECTOMY AND ROBOTIC TAMIS (N/A )     Patient location during evaluation: PACU Anesthesia Type: General Level of consciousness: awake and alert Pain management: pain level controlled Vital Signs Assessment: post-procedure vital signs reviewed and stable Respiratory status: spontaneous breathing, nonlabored ventilation, respiratory function stable and patient connected to nasal cannula oxygen Cardiovascular status: blood pressure returned to baseline and stable Postop Assessment: no apparent nausea or vomiting Anesthetic complications: no   No complications documented.  Last Vitals:  Vitals:   09/21/20 1518 09/21/20 1601  BP: 105/72 102/68  Pulse: 66 66  Resp:    Temp: 36.8 C 36.8 C  SpO2: 98% 98%    Last Pain:  Vitals:   09/21/20 1601  TempSrc: Oral  PainSc:                  Tiajuana Amass

## 2020-09-21 NOTE — Anesthesia Procedure Notes (Signed)
Procedure Name: Intubation Performed by: Ashli Selders J, CRNA Pre-anesthesia Checklist: Patient identified, Emergency Drugs available, Suction available, Patient being monitored and Timeout performed Patient Re-evaluated:Patient Re-evaluated prior to induction Oxygen Delivery Method: Circle system utilized Preoxygenation: Pre-oxygenation with 100% oxygen Induction Type: IV induction Ventilation: Mask ventilation without difficulty Laryngoscope Size: Mac and 4 Grade View: Grade I Tube type: Oral Tube size: 7.5 mm Number of attempts: 1 Airway Equipment and Method: Stylet Placement Confirmation: ETT inserted through vocal cords under direct vision,  positive ETCO2 and breath sounds checked- equal and bilateral Secured at: 23 cm Tube secured with: Tape Dental Injury: Teeth and Oropharynx as per pre-operative assessment        

## 2020-09-21 NOTE — Op Note (Addendum)
09/21/2020  10:50 AM  PATIENT:  Aaron Nash  55 y.o. male  Patient Care Team: Berenice Primas as PCP - General (Nurse Practitioner) Lavonna Monarch, MD as Consulting Physician (Dermatology) Michael Boston, MD as Consulting Physician (Colon and Rectal Surgery) Rogene Houston, MD as Consulting Physician (Gastroenterology)  PRE-OPERATIVE DIAGNOSIS:  RECURRENT RECTAL POLYP  POST-OPERATIVE DIAGNOSIS:  RECURRENT RECTAL POLYP  PROCEDURE:  PARTIAL PROCTECTOMY BY ROBOTIC TAMIS (Transanal minimally invasive surgery)  SURGEON:  Adin Hector, MD  ASSISTANT: OR Staff   ANESTHESIA:   local and general  EBL:  Total I/O In: 1600 [I.V.:1500; IV Piggyback:100] Out: 150 [Urine:100; Blood:50]  Delay start of Pharmacological VTE agent (>24hrs) due to surgical blood loss or risk of bleeding:  no  DRAINS: none   SPECIMEN:  Source of Specimen:  RECURRENT RECTAL MASS  DISPOSITION OF SPECIMEN:  PATHOLOGY  COUNTS:  YES  PLAN OF CARE: Admit for overnight observation  PATIENT DISPOSITION:  PACU - hemodynamically stable.  INDICATION: Presentation found to have rectal polyps and masses. Open low anterior resection with [facility] in 2019.  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  Work-up negative for metastatic disease.  Had endoscopic follow-up with recurrence at the anastomosis.  Endoscopic resection done by Dr. Laural Golden but with persistent recurrence.  Focus of high-grade dysplasia.  Request made for more definitive local excision.  The anatomy & physiology of the digestive tract was discussed.  The pathophysiology of the rectal pathology was discussed.  Natural history risks without surgery was discussed.   I feel the risks of no intervention will lead to serious problems that outweigh the operative risks; therefore, I recommended surgery.    Laparoscopic & open abdominal techniques were discussed.  I recommended we start with a partial  proctectomy by transanal endoscopic microsurgery (TEM) for excisional biopsy to remove the pathology and hopefully cure and/or control the pathology.  This technique can offer less operative risk and faster post-operative recovery.  Possible need for immediate or later abdominal surgery for further treatment was discussed.   Risks such as bleeding, abscess, reoperation, ostomy, heart attack, death, and other risks were discussed.   I noted a good likelihood this will help address the problem.  Goals of post-operative recovery were discussed as well.  We will work to minimize complications.  An educational handout was given as well.  Questions were answered.  The patient expresses understanding & wishes to proceed with surgery.  OR FINDINGS: Fibrotic concave polypoid mass posterior and left lateral section of priori coloanal anastomosis 7-11 cm from anal verge.  Significant tattooing and scarring in the region.  Circumferential [place] 1 cm margins.  Some diveting into the right posterior pelvis -most likely the right lateral corner of colon of the the colorectal stapled EEA anastomosis.  The resulting mass was 6 cm in size  Pin placement on pathology specimen: Proximal margin: Pink Distal margin: Black Left lateral: Yellow Right lateral:  White  The closure rests 6cm from the anal verge in the posterior & left lateral location.  It is 50% of the circumference.  DESCRIPTION: Informed consent was confirmed.  Patient received general anesthesia without difficulty.  Foley catheter sterilely placed.  Sequential compression devices active during the entire case.  The patient was placed in the prone position, taking extra care to secure and protect the patient appropriately.  The perineum and perianal regions were prepped and draped in a sterile fashion.  Surgical timeout confirmed our plan.  I did a gentle digital rectal examination with gradual anal dilation to allow placement of the Gelpoint system.   Secured with 0 Prolene interrupted sutures to the perineum.  Gelcap placed.  Air seal port placed.  Insufflated to 20 mm to good result.  Camera port placed anteriorly through the gel point.  Inspection noted that I could easily identify the mass.  Mass primarily posterior and extending left lateral.  Decent orientation.  Therefore, the [place] robot was brought in and oriented placed a port in the superior aspect.  Right lateral left lateral bariatric lung ports placed as well.  Instruments placed.  Did inspection.  I used the tip of the scissors to mark 1 cm margins circumferentially.  I then did a full thickness transection at the distal margin.  I came around laterally.  Lifted the specimen off the pelvic canal for a good deep margin.  The polyp dimpled posteriorly in a concave fashion.  I came through the mucosa of the colon proximal to the region.  Freed the polyp off the posterior pelvis and gradually came more proximally.  In the right posterior aspect there was some thickening.  I suspect it was the right lateral corner from the prior stapled colon that did not get incorporated into the EEA anastomosis.  I was able to bring that out en bloc grossly.  I ransected at the proximal margin.  I ensured hemostasis.  I inspected the main specimen and pinned it on thick cork board.  Pins as noted above.  I walked the specimen down to pathology and showed the [facility] pathology team for proper orientation.    I went back in and scrubbed in.  Hemostasis was good.  I reapproximated the wound with a 2-0 V-lock serrated Connel stitch in a running fashion, starting at the lateral corners and then meeting and overlapping in the center.  This brought the good nice transverse closure.  This brought things together well.  I did meticulous inspection with fine tip instruments to confirm good watertight closure   Hemostasis excellent.  The lumen was narrowed down to 4 cm but was patent.  Carbon dioxide evacuated & instruments  removed.  The patient is being extubated go to recovery room.  I discussed operative findings, updated the patient's status, discussed probable steps to recovery, and gave postoperative recommendations to the Patient's sister, Newt Lukes.  Recommendations were made.  Questions were answered.  She expressed understanding & appreciation.   Adin Hector, MD, FACS, MASCRS Gastrointestinal and Minimally Invasive Surgery    [address] [place], [place] [address] [phone] Main / Paging [phone] Fax

## 2020-09-22 ENCOUNTER — Encounter (HOSPITAL_COMMUNITY): Payer: Self-pay | Admitting: Surgery

## 2020-09-22 LAB — CBC
HCT: 41 % (ref 39.0–52.0)
Hemoglobin: 13.1 g/dL (ref 13.0–17.0)
MCH: 29.2 pg (ref 26.0–34.0)
MCHC: 32 g/dL (ref 30.0–36.0)
MCV: 91.3 fL (ref 80.0–100.0)
Platelets: 243 10*3/uL (ref 150–400)
RBC: 4.49 MIL/uL (ref 4.22–5.81)
RDW: 14.7 % (ref 11.5–15.5)
WBC: 13.3 10*3/uL — ABNORMAL HIGH (ref 4.0–10.5)
nRBC: 0 % (ref 0.0–0.2)

## 2020-09-22 LAB — BASIC METABOLIC PANEL
Anion gap: 6 (ref 5–15)
BUN: 10 mg/dL (ref 6–20)
CO2: 24 mmol/L (ref 22–32)
Calcium: 8.5 mg/dL — ABNORMAL LOW (ref 8.9–10.3)
Chloride: 109 mmol/L (ref 98–111)
Creatinine, Ser: 1.09 mg/dL (ref 0.61–1.24)
GFR, Estimated: >60 mL/min (ref >60)
Glucose, Bld: 95 mg/dL (ref 70–99)
Potassium: 3.8 mmol/L (ref 3.5–5.1)
Sodium: 139 mmol/L (ref 135–145)

## 2020-09-22 LAB — MAGNESIUM: Magnesium: 1.7 mg/dL (ref 1.7–2.4)

## 2020-09-22 MED ORDER — TRAMADOL HCL 50 MG PO TABS: 50.0000 mg | ORAL_TABLET | Freq: Four times a day (QID) | ORAL | 0 refills | Status: DC | PRN

## 2020-09-22 NOTE — Discharge Summary (Signed)
Physician Discharge Summary    Patient ID: Aaron Nash MRN: [id number] DOB/AGE: October 01, 1965  55 y.o.  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  Significant tattooing and scarring in the region.  Circumferential Yameli Delamater 1 cm margins.  Some diveting into the right posterior pelvis -most likely the right lateral corner of colon of the the colorectal stapled EEA anastomosis.  The resulting mass was 6 cm in size  Pin placement on pathology specimen: Proximal margin: Pink Distal margin: Black Left lateral: Yellow Right lateral:  White  The closure rests 6cm from the anal verge in the posterior & left lateral location.  It is 50% of the circumference.  Consults: None  Hospital Course:   The patient underwent the surgery above.  Postoperatively, the patient gradually mobilized and advanced to a solid diet.  Pain and other symptoms were treated aggressively.    By the time of discharge, the patient was walking well the hallways, eating food, having flatus.  Pain was well-controlled on an oral medications.  Based on meeting discharge criteria and continuing to recover, I felt  it was safe for the patient to be discharged from the hospital to further recover with close followup. Postoperative recommendations were discussed in detail.  They are written as well.  Discharged Condition: good  Discharge Exam: Blood pressure 115/69, pulse 67, temperature 98.1 F (36.7 C), temperature source Oral, resp. rate 18, height 5\' 9"  (1.753 m), weight 77.1 kg, SpO2 97 %.  General: Pt awake/alert/oriented x4 in No acute distress Eyes: PERRL, normal EOM.  Sclera clear.  No icterus Neuro: CN II-XII intact w/o focal sensory/motor deficits. Lymph: No head/neck/groin lymphadenopathy Psych:  No delerium/psychosis/paranoia HENT: Normocephalic, Mucus membranes moist.  No thrush Neck: Supple, No tracheal deviation Chest: No chest wall pain w good excursion CV:  Pulses intact.  Regular rhythm MS: Normal AROM mjr joints.  No obvious deformity Abdomen: Soft.  Nondistended.  Nontender.  No evidence of peritonitis.  No incarcerated hernias. Rectal - minimal perianal swelling Ext:  SCDs BLE.  No mjr edema.  No cyanosis Skin: No petechiae / purpura   Disposition:    Follow-up Information    Michael Boston, MD. Schedule an appointment as soon as possible for a visit in 3 weeks.   Specialties: General Surgery, Colon and Rectal Surgery Why: To follow up after your operation Contact information: [place] [phone]               Discharge disposition: 01-Home or Self Care       Discharge Instructions    Call MD for:   Complete by: As directed    FEVER > 101.5 F  (temperatures < 101.5 F are not significant)  Call MD for:  extreme fatigue   Complete by: As directed    Call MD for:  persistant dizziness or light-headedness   Complete by: As directed    Call MD for:  persistant nausea and vomiting   Complete by: As directed    Call MD for:  redness, tenderness, or signs of infection (pain, swelling, redness, odor or green/yellow  discharge around incision site)   Complete by: As directed    Call MD for:  severe uncontrolled pain   Complete by: As directed    Diet - low sodium heart healthy   Complete by: As directed    Start with a bland diet such as soups, liquids, starchy foods, low fat foods, etc. the first few days at home. Gradually advance to a solid, low-fat, high fiber diet by the end of the first week at home.   Add a fiber supplement to your diet (Metamucil, etc) If you feel full, bloated, or constipated, stay on a full liquid or pureed/blenderized diet for a few days until you feel better and are no longer constipated.   Discharge instructions   Complete by: As directed    See Discharge Instructions If you are not getting better after two weeks or are noticing you are getting worse, contact our office [phone] for further advice.  We may need to adjust your medications, re-evaluate you in the office, send you to the emergency room, or see what other things we can do to help. The clinic staff is available to answer your questions during regular business hours (8:30am-5pm).  Please don't hesitate to call and ask to speak to one of our nurses for clinical concerns.    A surgeon from [facility] is always on call at the hospitals 24 hours/day If you have a medical emergency, go to the nearest emergency room or call 911.   Driving Restrictions   Complete by: As directed    You may drive when: - you are no longer taking narcotic prescription pain medication - you can comfortably wear a seatbelt - you can safely make sudden turns/stops without pain.   Increase activity slowly   Complete by: As directed    Start light daily activities --- self-care, walking, climbing stairs- beginning the day after surgery.  Gradually increase activities as tolerated.  Control your pain to be active.  Stop when you are tired.  Ideally, walk several times a day, eventually an hour a day.   Most people are back  to most day-to-day activities in a few weeks.  It takes 4-6 weeks to get back to unrestricted, intense activity. If you can walk 30 minutes without difficulty, it is safe to try more intense activity such as jogging, treadmill, bicycling, low-impact aerobics, swimming, etc. Save the most intensive and strenuous activity for last (Usually 4-8 weeks after surgery) such as sit-ups, heavy lifting, contact sports, etc.  Refrain from any intense heavy lifting or straining until you are off narcotics for pain control.  You will have off days, but things should improve week-by-week. DO NOT PUSH THROUGH PAIN.  Let pain be your guide: If it hurts to do something, don't do it.   Lifting restrictions   Complete by: As directed    If you can walk 30 minutes without difficulty, it is safe to try more intense activity such as jogging, treadmill, bicycling, low-impact aerobics, swimming, etc. Save the most intensive and strenuous activity for last (Usually 4-8 weeks after surgery) such as  sit-ups, heavy lifting, contact sports, etc.   Refrain from any intense heavy lifting or straining until you are off narcotics for pain control.  You will have off days, but things should improve week-by-week. DO NOT PUSH THROUGH PAIN.  Let pain be your guide: If it hurts to do something, don't do it.  Pain is your body warning you to avoid that activity for another week until the pain goes down.   May shower / Bathe   Complete by: As directed    May walk up steps   Complete by: As directed    Sexual Activity Restrictions   Complete by: As directed    You may have sexual intercourse when it is comfortable. If it hurts to do something, stop.      Allergies as of 09/22/2020   No Known Allergies     Medication List    TAKE these medications   sildenafil 20 MG tablet Commonly known as: REVATIO Take 60 mg by mouth daily as needed for erectile dysfunction.   traMADol 50 MG tablet Commonly known as: ULTRAM Take 1-2 tablets  (50-100 mg total) by mouth every 6 (six) hours as needed for moderate pain or severe pain.       Significant Diagnostic Studies:  Results for orders placed or performed during the hospital encounter of 09/21/20 (from the past 72 hour(s))  Basic metabolic panel     Status: Abnormal   Collection Time: 09/22/20  4:41 AM  Result Value Ref Range   Sodium 139 135 - 145 mmol/L   Potassium 3.8 3.5 - 5.1 mmol/L   Chloride 109 98 - 111 mmol/L   CO2 24 22 - 32 mmol/L   Glucose, Bld 95 70 - 99 mg/dL    Comment: Glucose reference range applies only to samples taken after fasting for at least 8 hours.   BUN 10 6 - 20 mg/dL   Creatinine, Ser 1.09 0.61 - 1.24 mg/dL   Calcium 8.5 (L) 8.9 - 10.3 mg/dL   GFR, Estimated >60 >60 mL/min    Comment: (NOTE) Calculated using the CKD-EPI Creatinine Equation (2021)    Anion gap 6 5 - 15    Comment: Performed at [facility], [place] [address], [place], [place] [address]  CBC     Status: Abnormal   Collection Time: 09/22/20  4:41 AM  Result Value Ref Range   WBC 13.3 (H) 4.0 - 10.5 K/uL   RBC 4.49 4.22 - 5.81 MIL/uL   Hemoglobin 13.1 13.0 - 17.0 g/dL   HCT 41.0 39.0 - 52.0 %   MCV 91.3 80.0 - 100.0 fL   MCH 29.2 26.0 - 34.0 pg   MCHC 32.0 30.0 - 36.0 g/dL   RDW 14.7 11.5 - 15.5 %   Platelets 243 150 - 400 K/uL   nRBC 0.0 0.0 - 0.2 %    Comment: Performed at [facility], [place] [address], [place], [place] [address]  Magnesium     Status: None   Collection Time: 09/22/20  4:41 AM  Result Value Ref Range   Magnesium 1.7 1.7 - 2.4 mg/dL    Comment: Performed at [facility], [place] [address] [place], [place] [address]    No results found.  Past Medical History:  Diagnosis Date  . Atypical mole 01/08/2019   mild atypia on left upper back (free)  . Atypical mole 01/08/2019   mild atypia on lower right back (free)  . Basal cell carcinoma  12/12/2017   pigmented on right forehead [facility])  .  Cancer ([place])    skin cancer, basal cell  . Chronic diarrhea   . Colon cancer ([place]) 2019  . COVID-19 virus infection 03/2020  . COVID-19 virus infection 08/2020  . History of colonic polyps 06/2018   Patient states that there was numerous polyps and told one had a cancer seed in it.  Marland Kitchen SCCA (squamous cell carcinoma) of skin 01/12/2020   Left Forearm-Posterior (Keratoacanthoma) currette, cautery, 58fu    Past Surgical History:  Procedure Laterality Date  . COLONOSCOPY  06/2018   Dr.Cathey in [place]  . COLONOSCOPY N/A 11/11/2019   Procedure: COLONOSCOPY;  Surgeon: Rogene Houston, MD;  [facility];  Service: Endoscopy;  Laterality: N/A;  145  . FLEXIBLE SIGMOIDOSCOPY N/A 03/31/2020   Procedure: FLEXIBLE SIGMOIDOSCOPY;  Surgeon: Rogene Houston, MD;  Location[facility];  Service: Endoscopy;  Laterality: N/A;  200, per office pt knows new arrival time 6:30  . FLEXIBLE SIGMOIDOSCOPY N/A 06/09/2020   Procedure: FLEXIBLE SIGMOIDOSCOPY;  Surgeon: Rogene Houston, MD;  [facility];  Service: Endoscopy;  Laterality: N/A;  730,moved to 11/4 @ 1:00, pt test +on 8/31, does not need covid test < 90 days  . FRACTURE SURGERY Left    leg - injury as a child  . LOW ANTERIOR BOWEL RESECTION  06/27/2018   Rectal polyps - one with Tis cancer - [facility] - Dr Ladona Horns  . POLYPECTOMY  11/11/2019   Procedure: POLYPECTOMY;  Surgeon: Rogene Houston, MD;  [facility];  Service: Endoscopy;;  Hepatic and ascending colon polyp biopsy forcep,  rectal polyp distal to anastamosis, large rectal polyp (piecemeal) hot snare.  Marland Kitchen POLYPECTOMY  06/09/2020   Procedure: POLYPECTOMY;  Surgeon: Rogene Houston, MD;  [facility];  Service: Endoscopy;;  . XI ROBOT ASSISTED TRANSANAL RESECTION N/A 09/21/2020   Procedure: RECURRENT RECTAL POLYP PARTIAL PROCTECTOMY AND ROBOTIC TAMIS;  Surgeon: Michael Boston, MD;  Location: [facility];  Service: General;  Laterality: N/A;    Social  History   Socioeconomic History  . Marital status: Single    Spouse name: Not on file  . Number of children: Not on file  . Years of education: Not on file  . Highest education level: Not on file  Occupational History  . Not on file  Tobacco Use  . Smoking status: Former Smoker    Types: Cigarettes    Quit date: 10/27/2014    Years since quitting: 5.9  . Smokeless tobacco: Never Used  Vaping Use  . Vaping Use: Never used  Substance and Sexual Activity  . Alcohol use: Yes    Comment: Per patient rarely  . Drug use: Never  . Sexual activity: Yes  Other Topics Concern  . Not on file  Social History Narrative  . Not on file   Social Determinants of Health   Financial Resource Strain: Not on file  Food Insecurity: Not on file  Transportation Needs: Not on file  Physical Activity: Not on file  Stress: Not on file  Social Connections: Not on file  Intimate Partner Violence: Not on file    Family History  Problem Relation Age of Onset  . Osteoporosis Mother   . Hypertension Father   . Healthy Sister     Current Facility-Administered Medications  Medication Dose Route Frequency Provider Last Rate Last Admin  . 0.9 %  sodium chloride infusion   Intravenous  Q8H PRN Michael Boston, MD   Stopped at 09/22/20 0533  . acetaminophen (TYLENOL) tablet 1,000 mg  1,000 mg Oral Lajuana Ripple, MD   1,000 mg at 09/22/20 0532  . alum & mag hydroxide-simeth (MAALOX/MYLANTA) 200-200-20 MG/5ML suspension 30 mL  30 mL Oral Q6H PRN Michael Boston, MD      . alvimopan (ENTEREG) capsule 12 mg  12 mg Oral BID Michael Boston, MD      . diphenhydrAMINE (BENADRYL) 12.5 MG/5ML elixir 12.5 mg  12.5 mg Oral Q6H PRN Michael Boston, MD       Or  . diphenhydrAMINE (BENADRYL) injection 12.5 mg  12.5 mg Intravenous Q6H PRN Michael Boston, MD      . enalaprilat (VASOTEC) injection 0.625-1.25 mg  0.625-1.25 mg Intravenous Q6H PRN Michael Boston, MD      . enoxaparin (LOVENOX) injection 40 mg  40 mg  Subcutaneous Q24H Michael Boston, MD      . feeding supplement (ENSURE SURGERY) liquid 237 mL  237 mL Oral BID BM Michael Boston, MD   237 mL at 09/21/20 1545  . gabapentin (NEURONTIN) capsule 200 mg  200 mg Oral TID Michael Boston, MD   200 mg at 09/21/20 2057  . HYDROmorphone (DILAUDID) injection 0.5-2 mg  0.5-2 mg Intravenous Q4H PRN Michael Boston, MD      . lip balm (CARMEX) ointment 1 application  1 application Topical BID Michael Boston, MD   1 application at 78/67/67 2057  . magic mouthwash  15 mL Oral QID PRN Michael Boston, MD      . metoprolol tartrate (LOPRESSOR) injection 5 mg  5 mg Intravenous Q6H PRN Michael Boston, MD      . ondansetron [facility]) tablet 4 mg  4 mg Oral Q6H PRN Michael Boston, MD       Or  . ondansetron [facility]) injection 4 mg  4 mg Intravenous Q6H PRN Michael Boston, MD      . polycarbophil (FIBERCON) tablet 625 mg  625 mg Oral BID Michael Boston, MD   625 mg at 09/21/20 2057  . prochlorperazine (COMPAZINE) tablet 10 mg  10 mg Oral Q6H PRN Michael Boston, MD       Or  . prochlorperazine (COMPAZINE) injection 5-10 mg  5-10 mg Intravenous Q6H PRN Michael Boston, MD      . traMADol Veatrice Bourbon) tablet 50 mg  50 mg Oral Q6H PRN Michael Boston, MD         No Known Allergies  Signed: Morton Peters, MD, FACS, MASCRS Gastrointestinal and Minimally Invasive Surgery  [facility] [address] [place], [place] [address] [phone] Fax [phone] Main/Paging  CONTACT INFORMATION: Weekday (9AM-5PM) concerns: Call [facility] main office at [phone] Weeknight (5PM-9AM) or Weekend/Holiday concerns: Check [email or web address] for General Surgery [facility] coverage (Please, do not use SecureChat as it is not reliable communication to operating surgeons for immediate patient care)      09/22/2020, 7:19 AM

## 2020-09-22 NOTE — Progress Notes (Signed)
Pt alert and oriented, tolerating diet. D/C instructions given. Pt d/cd home.

## 2020-09-23 LAB — SURGICAL PATHOLOGY

## 2021-02-21 ENCOUNTER — Ambulatory Visit: Payer: 59 | Admitting: Dermatology

## 2021-11-13 IMAGING — CT CT ABDOMEN W/ CM
2 of 5 series · 15 of 46 positions shown, 17 images · IV contrast (Omnipaque or Isovue)
Comparison: No previous studies are available for direct
comparison.

CLINICAL DATA: Follow up hepatomegaly and abnormal CT. Patient
reports a rectal adenoma with incomplete polypectomy in November 2019.

EXAM:
CT ABDOMEN WITH CONTRAST
TECHNIQUE: Multidetector CT imaging of the abdomen was performed using the
standard protocol following bolus administration of intravenous
contrast.
CONTRAST:  75mL OMNIPAQUE IOHEXOL 300 MG/ML  SOLN

[Series 2: axial st · axial · 0.74mm/px · z∈[-572,-347]mm · 12 of 55 slices shown, 14 images]
[im 5/55  soft-tissue]
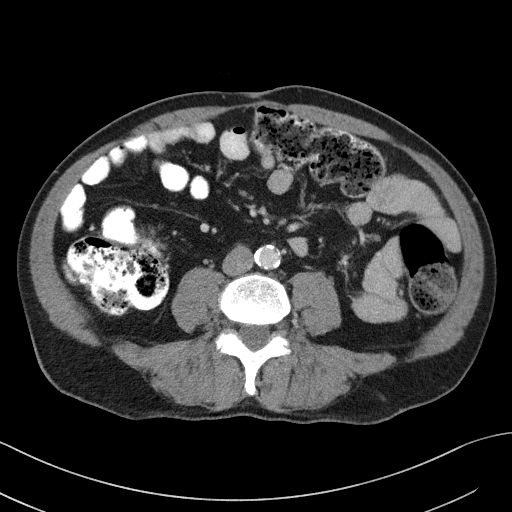
[im 5/55  bone]
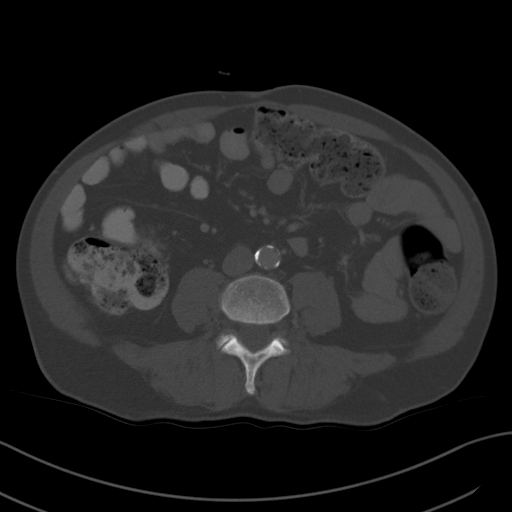
[im 9/55  soft-tissue]
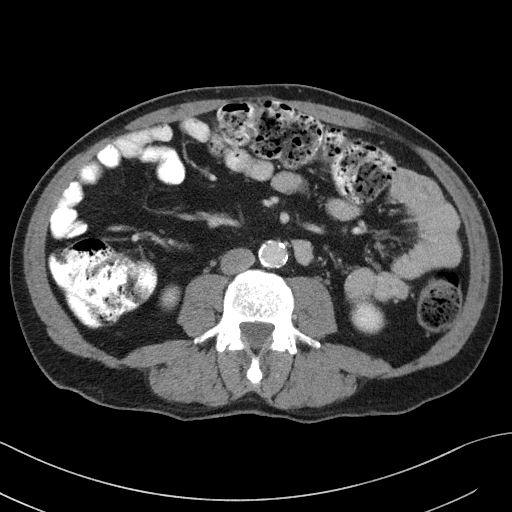
[im 13/55  soft-tissue]
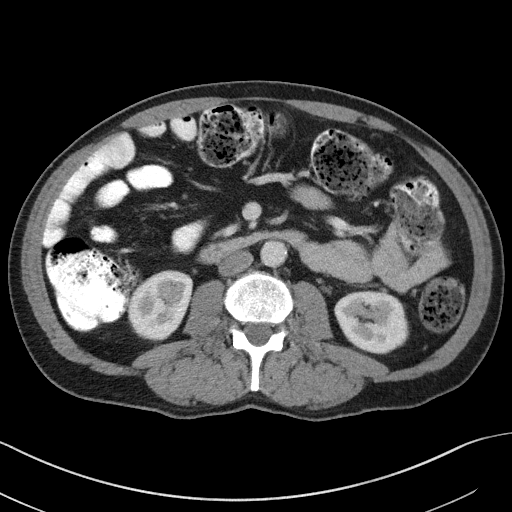
[im 17/55  soft-tissue]
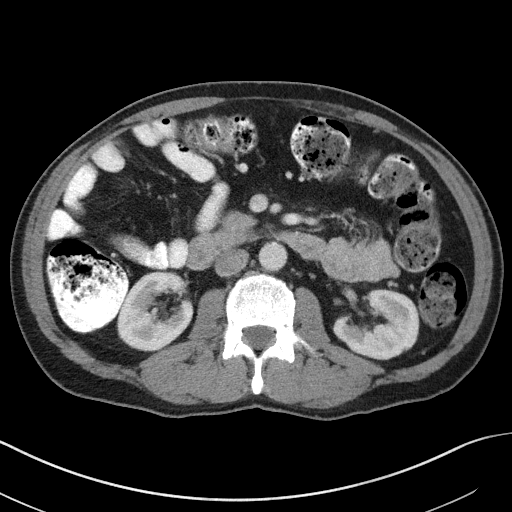
[im 21/55  soft-tissue]
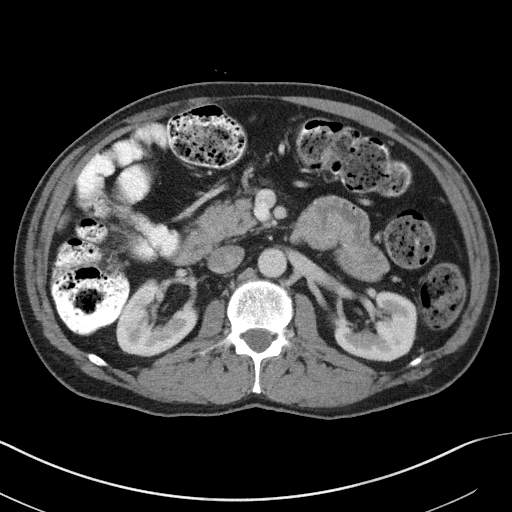
[im 25/55  soft-tissue]
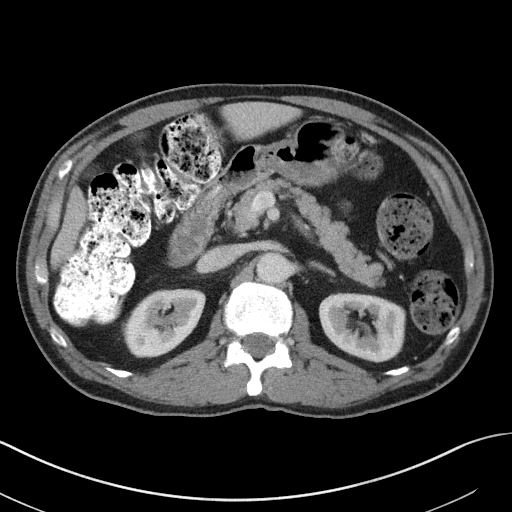
[im 30/55  soft-tissue]
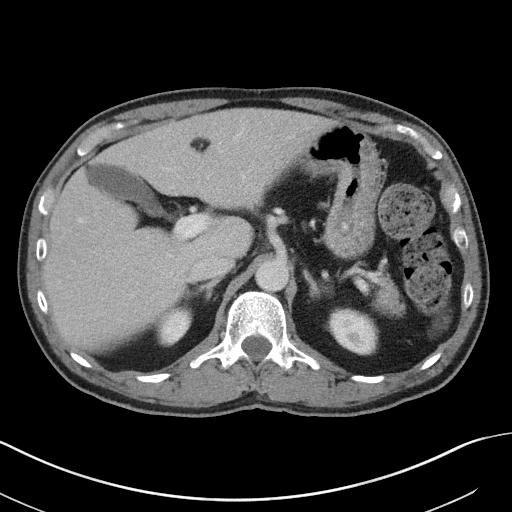
[im 34/55  soft-tissue]
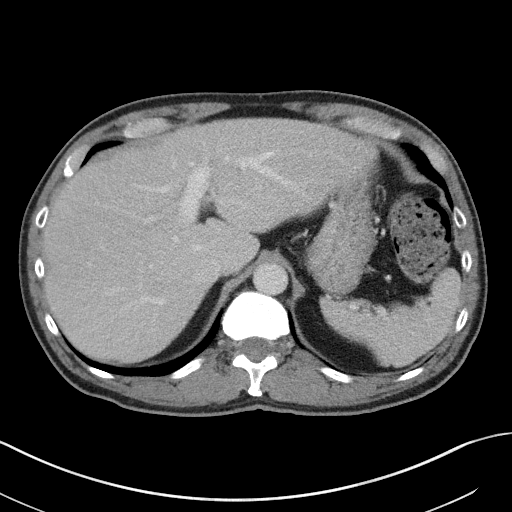
[im 38/55  soft-tissue]
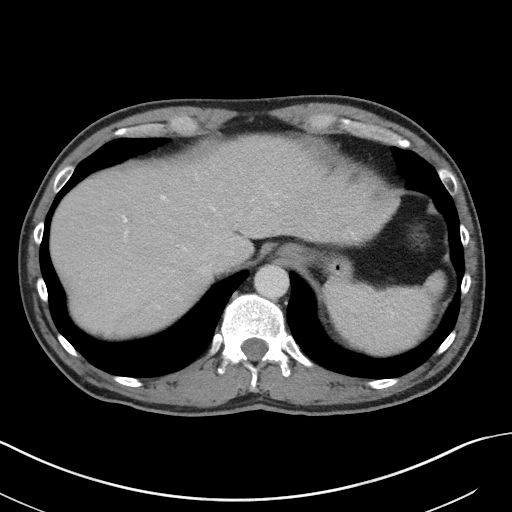
[im 38/55  bone]
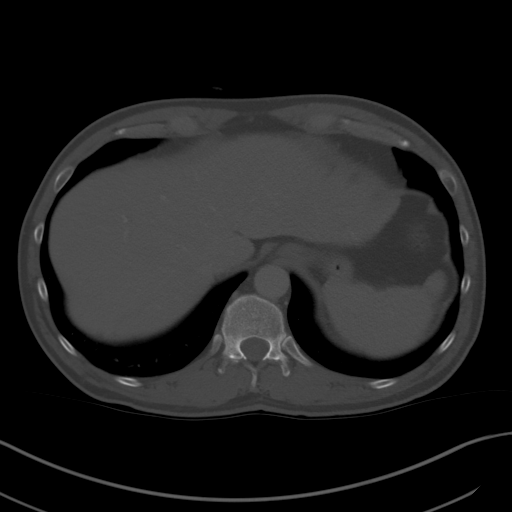
[im 42/55  soft-tissue]
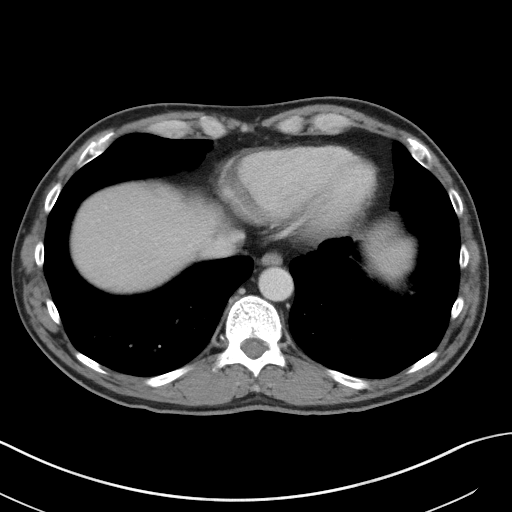
[im 46/55  soft-tissue]
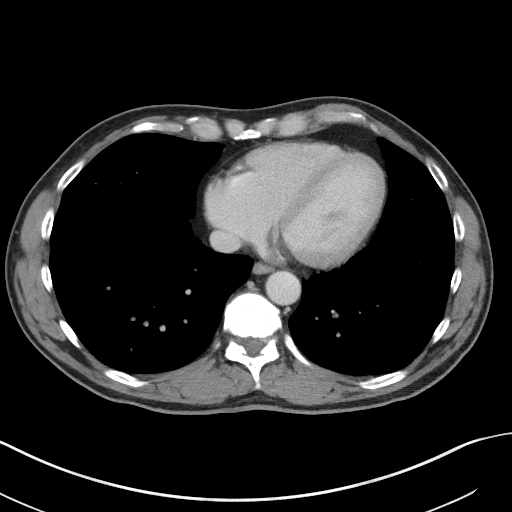
[im 50/55  soft-tissue]
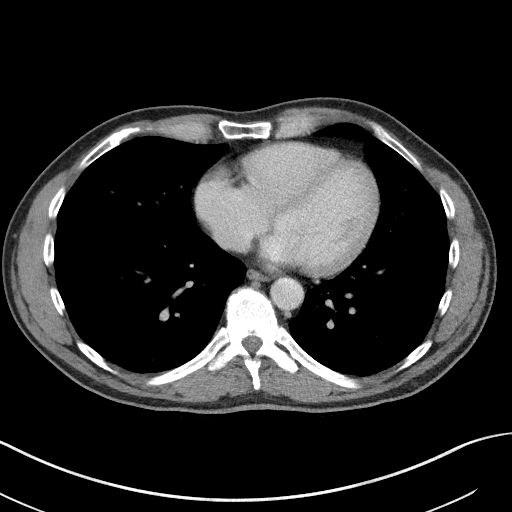

[Series 6: coronal st · coronal · 0.53mm/px · 3 of 117 slices shown]
[im 39/117  soft-tissue]
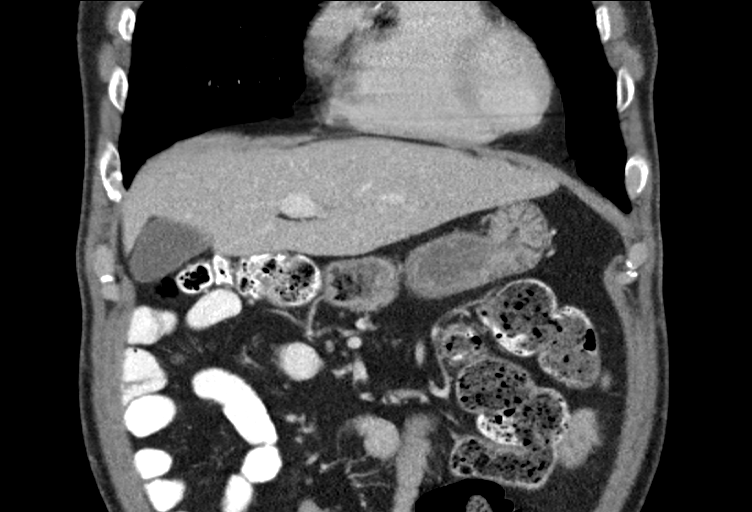
[im 52/117  soft-tissue]
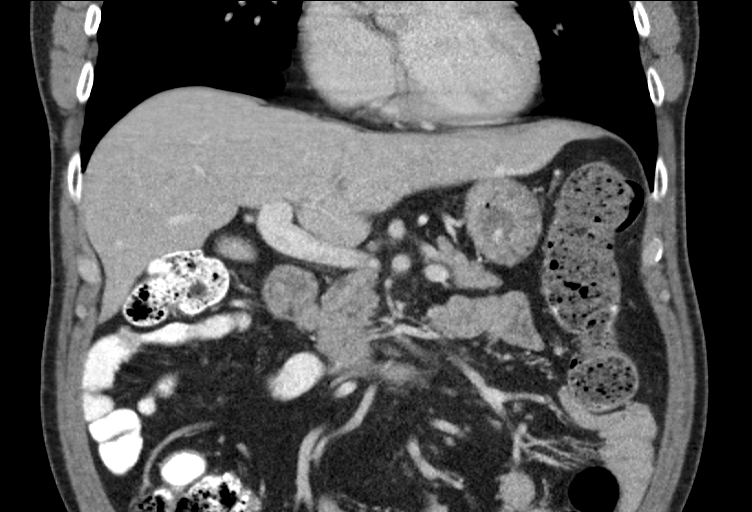
[im 65/117  soft-tissue]
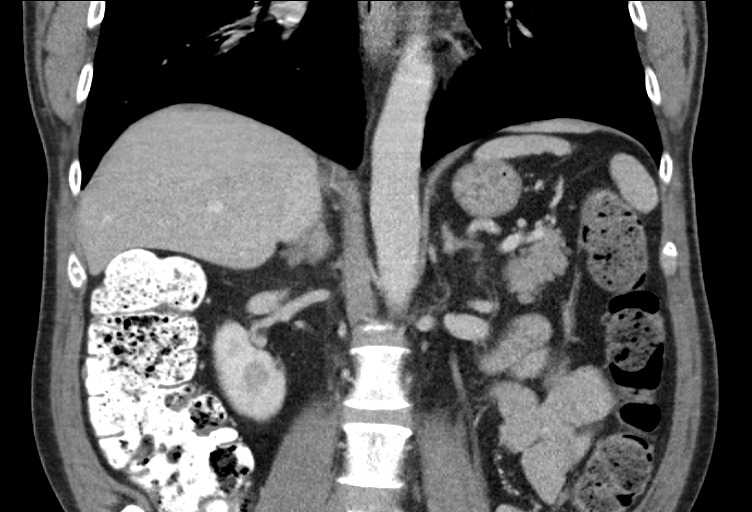

[15 of 46 positions shown; findings below may reference images not displayed]

The reports from an abdominopelvic CT 06/05/2018 and
abdominal MRI 06/13/2018 are correlated.
FINDINGS: Lower chest: Minimal dependent atelectasis at both lung bases. No
significant pleural or pericardial effusion.

Hepatobiliary: 5 mm well-circumscribed low-density lesion
posteriorly in the dome of the right hepatic lobe on image [DATE]
measures 15 HU, consistent with a small cysts. No other focal
hepatic abnormalities are identified. The hepatic density and volume
are within normal limits. No evidence of gallstones, gallbladder
wall thickening or biliary dilatation.

Pancreas: Unremarkable. No pancreatic ductal dilatation or
surrounding inflammatory changes.

Spleen: Normal in size without focal abnormality.

Adrenals/Urinary Tract: Both adrenal glands appear normal. There is
a 4 mm nonobstructing calculus in the interpolar region of the right
kidney (image 37/2). No evidence of proximal ureteral calculus or
hydronephrosis. The kidneys otherwise appear normal. Bladder not
imaged.

Stomach/Bowel: No evidence of bowel wall thickening, distention or
surrounding inflammatory change.Moderate stool throughout the
visualized colon. The pelvis was not imaged.

Vascular/Lymphatic: There are no enlarged abdominal lymph nodes.
Aortic and branch vessel atherosclerosis. The portal, superior
mesenteric and splenic veins appear normal.

Other: The visualized abdominal wall is intact.  No ascites.

Musculoskeletal: No acute or significant osseous findings.
IMPRESSION: 1. No acute or suspicious findings in the abdomen.
2. Small right hepatic cyst.
3. Nonobstructing right renal calculus.
4. Aortic Atherosclerosis ([id number]).

## 2022-01-24 ENCOUNTER — Encounter: Payer: Self-pay | Admitting: Dermatology

## 2022-01-24 ENCOUNTER — Ambulatory Visit (INDEPENDENT_AMBULATORY_CARE_PROVIDER_SITE_OTHER): Payer: 59 | Admitting: Dermatology

## 2022-01-24 DIAGNOSIS — L720 Epidermal cyst: Secondary | ICD-10-CM

## 2022-01-24 DIAGNOSIS — L821 Other seborrheic keratosis: Secondary | ICD-10-CM

## 2022-01-24 DIAGNOSIS — Z1283 Encounter for screening for malignant neoplasm of skin: Secondary | ICD-10-CM | POA: Diagnosis not present

## 2022-01-24 DIAGNOSIS — C44519 Basal cell carcinoma of skin of other part of trunk: Secondary | ICD-10-CM | POA: Diagnosis not present

## 2022-01-24 DIAGNOSIS — L570 Actinic keratosis: Secondary | ICD-10-CM

## 2022-01-24 DIAGNOSIS — L918 Other hypertrophic disorders of the skin: Secondary | ICD-10-CM | POA: Diagnosis not present

## 2022-01-24 DIAGNOSIS — D485 Neoplasm of uncertain behavior of skin: Secondary | ICD-10-CM

## 2022-01-24 DIAGNOSIS — C4491 Basal cell carcinoma of skin, unspecified: Secondary | ICD-10-CM

## 2022-01-24 DIAGNOSIS — L820 Inflamed seborrheic keratosis: Secondary | ICD-10-CM

## 2022-01-24 HISTORY — DX: Basal cell carcinoma of skin, unspecified: C44.91

## 2022-01-24 NOTE — Patient Instructions (Signed)

## 2022-01-30 ENCOUNTER — Telehealth: Payer: Self-pay

## 2022-01-30 NOTE — Telephone Encounter (Signed)
Phone call to patient with his pathology results. Patient aware of results.  

## 2022-02-20 ENCOUNTER — Encounter: Payer: Self-pay | Admitting: Dermatology

## 2022-02-20 NOTE — Progress Notes (Signed)
Follow-Up Visit   Subjective  Aaron Nash is a 56 y.o. male who presents for the following: Annual Exam (Skin check, personal history of bcc scc and atypia. Check dark lesion on the chest).  General skin examination, new spots on chest Location:  Duration:  Quality:  Associated Signs/Symptoms: Modifying Factors:  Severity:  Timing: Context:   Objective  Well appearing patient in no apparent distress; mood and affect are within normal limits. Full body skin examination: No atypical pigmented lesions.  Possible nonmelanoma skin cancer on chest will be biopsied/treated  Several 4 to 10 mm brown flattopped textured papules, compatible dermoscopy  Left Zygomatic Area Gritty 5 mm pink crust  Left Malar Cheek Noninflamed 5 mm white dermal papule  right chest - superior Pink waxy 8 mm crust, rule out carcinoma     Right chest- inferior 7 Millimeter pearly papule, BCC         A full examination was performed including scalp, head, eyes, ears, nose, lips, neck, chest, axillae, abdomen, back, buttocks, bilateral upper extremities, bilateral lower extremities, hands, feet, fingers, toes, fingernails, and toenails. All findings within normal limits unless otherwise noted below.   Assessment & Plan    Encounter for screening for malignant neoplasm of skin  Annual skin examination.  Encouraged to self examine twice annually.  Seborrheic keratosis  Leave if stable  AK (actinic keratosis) Left Zygomatic Area  Destruction of lesion - Left Zygomatic Area Complexity: simple   Destruction method: cryotherapy   Informed consent: discussed and consent obtained   Timeout:  patient name, date of birth, surgical site, and procedure verified Lesion destroyed using liquid nitrogen: Yes   Cryotherapy cycles:  5 Outcome: patient tolerated procedure well with no complications    Skin tag Neck - Anterior  Epidermal cyst Left Malar Cheek  Elective excision  discussed, no procedure scheduled  Neoplasm of uncertain behavior of skin right chest - superior  Skin / nail biopsy Type of biopsy: tangential   Informed consent: discussed and consent obtained   Timeout: patient name, date of birth, surgical site, and procedure verified   Procedure prep:  Patient was prepped and draped in usual sterile fashion (Non sterile) Prep type:  Chlorhexidine Anesthesia: the lesion was anesthetized in a standard fashion   Anesthetic:  1% lidocaine w/ epinephrine 1-100,000 local infiltration Instrument used: flexible razor blade   Outcome: patient tolerated procedure well   Post-procedure details: wound care instructions given    Specimen 1 - Surgical pathology Differential Diagnosis: r/o sk  Check Margins: No  Basal cell carcinoma (BCC) of chest Right chest- inferior  Skin / nail biopsy Type of biopsy: tangential   Informed consent: discussed and consent obtained   Timeout: patient name, date of birth, surgical site, and procedure verified   Procedure prep:  Patient was prepped and draped in usual sterile fashion (Non sterile) Prep type:  Chlorhexidine Anesthesia: the lesion was anesthetized in a standard fashion   Anesthetic:  1% lidocaine w/ epinephrine 1-100,000 local infiltration Instrument used: flexible razor blade   Outcome: patient tolerated procedure well   Post-procedure details: wound care instructions given    Destruction of lesion Complexity: simple   Destruction method: electrodesiccation and curettage   Informed consent: discussed and consent obtained   Timeout:  patient name, date of birth, surgical site, and procedure verified Anesthesia: the lesion was anesthetized in a standard fashion   Anesthetic:  1% lidocaine w/ epinephrine 1-100,000 local infiltration Curettage performed in three different directions:  Yes   Electrodesiccation performed over the curetted area: Yes   Curettage cycles:  3 Lesion length (cm):  1.1 Lesion  width (cm):  1.1 Margin per side (cm):  0 Final wound size (cm):  1.1 Hemostasis achieved with:  aluminum chloride Outcome: patient tolerated procedure well with no complications   Post-procedure details: wound care instructions given    Specimen 2 - Surgical pathology Differential Diagnosis: bcc vs scc- txpbx  Check Margins: No  After shave biopsy the base of the lesion was treated with curettage plus cautery      I, Lavonna Monarch, MD, have reviewed all documentation for this visit.  The documentation on 02/20/22 for the exam, diagnosis, procedures, and orders are all accurate and complete.

## 2022-04-24 DIAGNOSIS — S0502XA Injury of conjunctiva and corneal abrasion without foreign body, left eye, initial encounter: Secondary | ICD-10-CM | POA: Diagnosis not present

## 2022-07-23 ENCOUNTER — Ambulatory Visit: Payer: Self-pay | Admitting: Internal Medicine

## 2022-08-16 ENCOUNTER — Ambulatory Visit: Payer: 59 | Admitting: Internal Medicine

## 2022-08-16 ENCOUNTER — Encounter: Payer: Self-pay | Admitting: Internal Medicine

## 2022-08-16 VITALS — BP 112/71 | HR 82 | Ht 69.0 in | Wt 184.6 lb

## 2022-08-16 DIAGNOSIS — Z1329 Encounter for screening for other suspected endocrine disorder: Secondary | ICD-10-CM | POA: Diagnosis not present

## 2022-08-16 DIAGNOSIS — K621 Rectal polyp: Secondary | ICD-10-CM

## 2022-08-16 DIAGNOSIS — Z2821 Immunization not carried out because of patient refusal: Secondary | ICD-10-CM

## 2022-08-16 DIAGNOSIS — Z131 Encounter for screening for diabetes mellitus: Secondary | ICD-10-CM

## 2022-08-16 DIAGNOSIS — Z114 Encounter for screening for human immunodeficiency virus [HIV]: Secondary | ICD-10-CM

## 2022-08-16 DIAGNOSIS — Z85828 Personal history of other malignant neoplasm of skin: Secondary | ICD-10-CM | POA: Insufficient documentation

## 2022-08-16 DIAGNOSIS — Z1159 Encounter for screening for other viral diseases: Secondary | ICD-10-CM

## 2022-08-16 DIAGNOSIS — Z8639 Personal history of other endocrine, nutritional and metabolic disease: Secondary | ICD-10-CM | POA: Insufficient documentation

## 2022-08-16 DIAGNOSIS — Z1321 Encounter for screening for nutritional disorder: Secondary | ICD-10-CM | POA: Diagnosis not present

## 2022-08-16 DIAGNOSIS — R69 Illness, unspecified: Secondary | ICD-10-CM | POA: Diagnosis not present

## 2022-08-16 DIAGNOSIS — N529 Male erectile dysfunction, unspecified: Secondary | ICD-10-CM | POA: Diagnosis not present

## 2022-08-16 DIAGNOSIS — Z0001 Encounter for general adult medical examination with abnormal findings: Secondary | ICD-10-CM | POA: Diagnosis not present

## 2022-08-16 NOTE — Assessment & Plan Note (Signed)
Currently prescribed sildenafil for as needed use.

## 2022-08-16 NOTE — Patient Instructions (Signed)
It was a pleasure to see you today.  Thank you for giving [place] the opportunity to be involved in your care.  Below is a brief recap of your visit and next steps.  We will plan to see you again in 3 months.  Summary You have established care today We will check basic labs You can come in for your flu shot whenever is convenient for you. You do not need a doctor's appointment We will plan for follow up in 3 months

## 2022-08-16 NOTE — Progress Notes (Signed)
New Patient Office Visit  Subjective    Patient ID: Aaron Nash, male    DOB: 20-Jul-1966  Age: 57 y.o. MRN: [id number]  CC:  Chief Complaint  Patient presents with   Establish Care   HPI Aaron Nash presents to establish care.  He is a 57 year old male with a past medical history significant for colon/rectal polyps s/p resection and basal cell carcinoma of the chest s/p excision.  He has most recently been followed by [facility].  He endorses a history of former tobacco use, quit 2016.  Medical history is significant for osteoporosis and hypertension.  Aaron Nash is currently taking sildenafil needed basis but is otherwise not taking any occasions regularly.  His acute concern today is needing GI follow-up.  He most recently underwent partial proctectomy recurrent polypoid mass in February 2022.  He had previously been seen by Dr. Laural Nash.  His last office appointment with Dr. Laural Nash was in March 2021.  He then underwent flexible sigmoidoscopy in August and November 2021 with Dr. Laural Nash.  He has not had any follow-up since his surgery 2022.  Of note, he has also been followed by dermatology (Dr. Denna Nash) excision of a chest lesion to be basal cell carcinoma in June 2023.  He will need annual follow-up this summer.  He has requested a new dermatology referral today as previous dermatologist is no longer practicing.  Acute concerns, chronic medical conditions, and outside preventive care items discussed today are individually addressed in A/P below.  Outpatient Encounter Medications as of 08/16/2022  Medication Sig   sildenafil (REVATIO) 20 MG tablet Take 60 mg by mouth daily as needed for erectile dysfunction.   [DISCONTINUED] traMADol (ULTRAM) 50 MG tablet Take 1-2 tablets (50-100 mg total) by mouth every 6 (six) hours as needed for moderate pain or severe pain. (Patient not taking: Reported on 01/24/2022)   No facility-administered encounter medications on file as of  08/16/2022.    Past Medical History:  Diagnosis Date   Atypical mole 01/08/2019   mild atypia on left upper back (free)   Atypical mole 01/08/2019   mild atypia on lower right back (free)   Basal cell carcinoma 12/12/2017   pigmented on right forehead (MOHs)   Cancer (HCC)    skin cancer, basal cell   Chronic diarrhea    Colon cancer ([place]) 2019   COVID-19 virus infection 03/2020   COVID-19 virus infection 08/2020   History of colonic polyps 06/2018   Patient states that there was numerous polyps and told one had a cancer seed in it.   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    Past Surgical History:  Procedure Laterality Date   COLONOSCOPY  06[place]  COLONOSCOPY N/A 11/11/2019   Procedure: COLONOSCOPY;  Surgeon: RRogene Houston MD;  [facility];  Service: Endoscopy;  Laterality: N/A;  [place]/A 03/31/2020   Procedure: FLEXIBLE SIGMOIDOSCOPY;  Surgeon: RRogene Houston MD;  [facility];  Service: Endoscopy;  Laterality: N/A;  200, per office pt knows new arrival time 6:30   FLEXIBLE SIGMOIDOSCOPY N/A 06/09/2020   Procedure: FLEXIBLE SIGMOIDOSCOPY;  Surgeon: RRogene Houston MD;  [facility];  Service: Endoscopy;  Laterality: N/A;  730,moved to 11/4 @ 1:00, pt test +on  8/31, does not need covid test < 90 days   FRACTURE SURGERY Left    leg - injury as a child   LOW ANTERIOR BOWEL RESECTION  06/27/2018   Rectal polyps - one with Tis cancer - [facility] - Dr Ladona Horns   POLYPECTOMY  11/11/2019   Procedure: POLYPECTOMY;  Surgeon: Rogene Houston, MD;  [facility];  Service: Endoscopy;;  Hepatic and ascending colon polyp biopsy forcep,  rectal polyp distal to anastamosis, large rectal polyp (piecemeal) hot snare.   POLYPECTOMY   06/09/2020   Procedure: POLYPECTOMY;  Surgeon: Rogene Houston, MD;  [facility];  Service: Endoscopy;;   XI ROBOT ASSISTED TRANSANAL RESECTION N/A 09/21/2020   Procedure: RECURRENT RECTAL POLYP PARTIAL PROCTECTOMY AND ROBOTIC TAMIS;  Surgeon: Michael Boston, MD;  [facility];  Service: General;  Laterality: N/A;    Family History  Problem Relation Age of Onset   Osteoporosis Mother    Hypertension Father    Healthy Sister     Social History   Socioeconomic History   Marital status: Single    Spouse name: Not on file   Number of children: Not on file   Years of education: Not on file   Highest education level: Not on file  Occupational History   Not on file  Tobacco Use   Smoking status: Former    Types: Cigarettes    Quit date: 10/27/2014    Years since quitting: 7.8   Smokeless tobacco: Never  Vaping Use   Vaping Use: Never used  Substance and Sexual Activity   Alcohol use: Yes    Comment: Per patient rarely   Drug use: Never   Sexual activity: Yes  Other Topics Concern   Not on file  Social History Narrative   Not on file   Social Determinants of Health   Financial Resource Strain: Not on file  Food Insecurity: Not on file  Transportation Needs: Not on file  Physical Activity: Not on file  Stress: Not on file  Social Connections: Not on file  Intimate Partner Violence: Not on file   Review of Systems  Constitutional:  Negative for chills and fever.  HENT:  Negative for sore throat.   Respiratory:  Negative for cough and shortness of breath.   Cardiovascular:  Negative for chest pain, palpitations and leg swelling.  Gastrointestinal:  Negative for abdominal pain, blood in stool, constipation, diarrhea, nausea and vomiting.  Genitourinary:  Negative for dysuria and hematuria.  Musculoskeletal:  Negative for myalgias.  Skin:  Negative for itching and rash.  Neurological:  Negative for dizziness and headaches.  Psychiatric/Behavioral:   Negative for depression and suicidal ideas.    Objective    BP 112/71   Pulse 82   Ht '5\' 9"'$  (1.753 m)   Wt 184 lb 9.6 oz (83.7 kg)   SpO2 96%   BMI 27.26 kg/m   Physical Exam Vitals reviewed.  Constitutional:      General: He is not in acute distress.    Appearance: Normal appearance. He is not ill-appearing.  HENT:     Head: Normocephalic and atraumatic.     Right Ear: External ear normal.     Left Ear: External ear normal.     Nose: Nose normal. No congestion or rhinorrhea.     Mouth/Throat:     Mouth: Mucous membranes are moist.     Pharynx: Oropharynx is clear.  Eyes:     General: No scleral icterus.  Extraocular Movements: Extraocular movements intact.     Conjunctiva/sclera: Conjunctivae normal.     Pupils: Pupils are equal, round, and reactive to light.  Cardiovascular:     Rate and Rhythm: Normal rate and regular rhythm.     Pulses: Normal pulses.     Heart sounds: Normal heart sounds. No murmur heard. Pulmonary:     Effort: Pulmonary effort is normal.     Breath sounds: Normal breath sounds. No wheezing, rhonchi or rales.  Abdominal:     General: Abdomen is flat. Bowel sounds are normal. There is no distension.     Palpations: Abdomen is soft.     Tenderness: There is no abdominal tenderness.     Comments: Well-healed midline abdominal surgical incision  Musculoskeletal:        General: No swelling or deformity. Normal range of motion.     Cervical back: Normal range of motion.  Skin:    General: Skin is warm and dry.     Capillary Refill: Capillary refill takes less than 2 seconds.  Neurological:     General: No focal deficit present.     Mental Status: He is alert and oriented to person, place, and time.     Motor: No weakness.  Psychiatric:        Mood and Affect: Mood normal.        Behavior: Behavior normal.        Thought Content: Thought content normal.    Assessment & Plan:   Problem List Items Addressed This Visit       Recurrent  rectal polyp    His past medical history is significant for multiple colon/rectal polyps that have required surgical resection.  He was last seen by GI in 2021.  Underwent resection most recently in February 2022.  He has not been seen for follow-up since this time.  He is asymptomatic currently. -Previously followed by Dr. Laural Nash.  He has been provided with contact office for their office today.  If new referral is needed, we will gladly place one.      History of basal cell carcinoma - Primary    Previously followed by dermatology (Dr. Denna Nash).  Last seen in June 2023.  He underwent excision of a lesion on his chest that was found to be basal cell carcinoma.  Today he requests a new referral to dermatology as Dr. Denna Nash is no longer practicing. -New dermatology referral has been placed      Erectile dysfunction    Currently prescribed sildenafil for as needed use.      History of hypercholesterolemia    He endorses a history of hypercholesterolemia.  He is not currently on any cholesterol-lowering therapy.  He reports that he was previously prescribed a statin but did not tolerate the medication due to flulike symptoms.  He cannot recall the name of the medication he was prescribed. -Repeat lipid panel ordered today      Encounter for general adult medical examination with abnormal findings    Presenting today to establish care.  Previous records and labs been reviewed. -Baseline labs ordered today, including one-time HIV/HCV screenings -He will follow-up for a nurse appointment to receive his influenza vaccine next week -We will tentatively plan for follow-up in 3 months      Return in about 3 months (around 11/15/2022).   Johnette Abraham, MD

## 2022-08-16 NOTE — Assessment & Plan Note (Addendum)
Presenting today to establish care.  Previous records and labs been reviewed. -Baseline labs ordered today, including one-time HIV/HCV screenings -He will follow-up for a nurse appointment to receive his influenza vaccine next week -We will tentatively plan for follow-up in 3 months

## 2022-08-16 NOTE — Assessment & Plan Note (Signed)
Previously followed by dermatology (Dr. Denna Haggard).  Last seen in June 2023.  He underwent excision of a lesion on his chest that was found to be basal cell carcinoma.  Today he requests a new referral to dermatology as Dr. Denna Haggard is no longer practicing. -New dermatology referral has been placed

## 2022-08-16 NOTE — Assessment & Plan Note (Signed)
He endorses a history of hypercholesterolemia.  He is not currently on any cholesterol-lowering therapy.  He reports that he was previously prescribed a statin but did not tolerate the medication due to flulike symptoms.  He cannot recall the name of the medication he was prescribed. -Repeat lipid panel ordered today

## 2022-08-16 NOTE — Assessment & Plan Note (Signed)
His past medical history is significant for multiple colon/rectal polyps that have required surgical resection.  He was last seen by GI in 2021.  Underwent resection most recently in February 2022.  He has not been seen for follow-up since this time.  He is asymptomatic currently. -Previously followed by Dr. Laural Golden.  He has been provided with contact office for their office today.  If new referral is needed, we will gladly place one.

## 2022-08-17 ENCOUNTER — Other Ambulatory Visit: Payer: Self-pay | Admitting: Internal Medicine

## 2022-08-17 DIAGNOSIS — E782 Mixed hyperlipidemia: Secondary | ICD-10-CM

## 2022-08-17 LAB — HCV AB W REFLEX TO QUANT PCR: HCV Ab: NONREACTIVE

## 2022-08-17 LAB — CBC WITH DIFFERENTIAL/PLATELET
Basophils Absolute: 0.1 10*3/uL (ref 0.0–0.2)
Basos: 1 %
EOS (ABSOLUTE): 0.4 10*3/uL (ref 0.0–0.4)
Eos: 4 %
Hematocrit: 43.8 % (ref 37.5–51.0)
Hemoglobin: 15.1 g/dL (ref 13.0–17.7)
Immature Grans (Abs): 0 10*3/uL (ref 0.0–0.1)
Immature Granulocytes: 0 %
Lymphocytes Absolute: 1.8 10*3/uL (ref 0.7–3.1)
Lymphs: 17 %
MCH: 29.4 pg (ref 26.6–33.0)
MCHC: 34.5 g/dL (ref 31.5–35.7)
MCV: 85 fL (ref 79–97)
Monocytes Absolute: 0.6 10*3/uL (ref 0.1–0.9)
Monocytes: 6 %
Neutrophils Absolute: 7.5 10*3/uL — ABNORMAL HIGH (ref 1.4–7.0)
Neutrophils: 72 %
Platelets: 321 10*3/uL (ref 150–450)
RBC: 5.14 x10E6/uL (ref 4.14–5.80)
RDW: 13.3 % (ref 11.6–15.4)
WBC: 10.5 10*3/uL (ref 3.4–10.8)

## 2022-08-17 LAB — CMP14+EGFR
ALT: 11 IU/L (ref 0–44)
AST: 14 IU/L (ref 0–40)
Albumin/Globulin Ratio: 1.6 (ref 1.2–2.2)
Albumin: 4.1 g/dL (ref 3.8–4.9)
Alkaline Phosphatase: 84 IU/L (ref 44–121)
BUN/Creatinine Ratio: 18 (ref 9–20)
BUN: 16 mg/dL (ref 6–24)
Bilirubin Total: 0.6 mg/dL (ref 0.0–1.2)
CO2: 22 mmol/L (ref 20–29)
Calcium: 9.2 mg/dL (ref 8.7–10.2)
Chloride: 104 mmol/L (ref 96–106)
Creatinine, Ser: 0.9 mg/dL (ref 0.76–1.27)
Globulin, Total: 2.6 g/dL (ref 1.5–4.5)
Glucose: 86 mg/dL (ref 70–99)
Potassium: 4.9 mmol/L (ref 3.5–5.2)
Sodium: 141 mmol/L (ref 134–144)
Total Protein: 6.7 g/dL (ref 6.0–8.5)
eGFR: 100 mL/min/{1.73_m2} (ref >59)

## 2022-08-17 LAB — HEMOGLOBIN A1C
Est. average glucose Bld gHb Est-mCnc: 126 mg/dL
Hgb A1c MFr Bld: 6 % — ABNORMAL HIGH (ref 4.8–5.6)

## 2022-08-17 LAB — LIPID PANEL
Chol/HDL Ratio: 5.4 ratio — ABNORMAL HIGH (ref 0.0–5.0)
Cholesterol, Total: 204 mg/dL — ABNORMAL HIGH (ref 100–199)
HDL: 38 mg/dL — ABNORMAL LOW (ref >39)
LDL Chol Calc (NIH): 136 mg/dL — ABNORMAL HIGH (ref 0–99)
Triglycerides: 165 mg/dL — ABNORMAL HIGH (ref 0–149)
VLDL Cholesterol Cal: 30 mg/dL (ref 5–40)

## 2022-08-17 LAB — HCV INTERPRETATION

## 2022-08-17 LAB — TSH+FREE T4
Free T4: 1.42 ng/dL (ref 0.82–1.77)
TSH: 0.951 u[IU]/mL (ref 0.450–4.500)

## 2022-08-17 LAB — B12 AND FOLATE PANEL
Folate: 10.6 ng/mL (ref >3.0)
Vitamin B-12: 204 pg/mL — ABNORMAL LOW (ref 232–1245)

## 2022-08-17 LAB — VITAMIN D 25 HYDROXY (VIT D DEFICIENCY, FRACTURES): Vit D, 25-Hydroxy: 27.6 ng/mL — ABNORMAL LOW (ref 30.0–100.0)

## 2022-08-17 LAB — HIV ANTIBODY (ROUTINE TESTING W REFLEX): HIV Screen 4th Generation wRfx: NONREACTIVE

## 2022-08-17 MED ORDER — ATORVASTATIN CALCIUM 20 MG PO TABS: 20.0000 mg | ORAL_TABLET | Freq: Every day | ORAL | 1 refills | Status: DC

## 2022-10-22 ENCOUNTER — Ambulatory Visit: Payer: 59 | Admitting: Internal Medicine

## 2022-10-22 ENCOUNTER — Encounter: Payer: Self-pay | Admitting: Internal Medicine

## 2022-10-22 VITALS — Resp 16 | Ht 69.0 in | Wt 182.0 lb

## 2022-10-22 DIAGNOSIS — R42 Dizziness and giddiness: Secondary | ICD-10-CM | POA: Diagnosis not present

## 2022-10-22 DIAGNOSIS — B356 Tinea cruris: Secondary | ICD-10-CM | POA: Diagnosis not present

## 2022-10-22 DIAGNOSIS — H66003 Acute suppurative otitis media without spontaneous rupture of ear drum, bilateral: Secondary | ICD-10-CM | POA: Diagnosis not present

## 2022-10-22 MED ORDER — AMOXICILLIN-POT CLAVULANATE 875-125 MG PO TABS: 1.0000 | ORAL_TABLET | Freq: Two times a day (BID) | ORAL | 0 refills | Status: DC

## 2022-10-22 MED ORDER — TERBINAFINE HCL 1 % EX CREA: 1.0000 | TOPICAL_CREAM | Freq: Two times a day (BID) | CUTANEOUS | 0 refills | Status: DC

## 2022-10-22 NOTE — Patient Instructions (Signed)
Thank you for trusting me with your care. To recap, today we discussed the following:  Dizziness You have an ear infection.  I prescribed antibiotics for you to take for 10 days.  If you have any symptoms after taking the antibiotics please let me know and I will send you for imaging. Make sure you are drinking plenty of fluids when working outdoors.   Rash , Tinea Cruris Use cream daily for 1 to 2 weeks.

## 2022-10-22 NOTE — Assessment & Plan Note (Addendum)
Tinea cruris right inner thigh. Treat with topical antifungal for 2 weeks Follow up if symptoms worsen or fail to improve

## 2022-10-22 NOTE — Assessment & Plan Note (Addendum)
Bilateral ear infection. Take Augmentin as prescribed Follow up if symptoms worsen or fail to improve

## 2022-10-22 NOTE — Progress Notes (Signed)
   HPI:[name] is a 57 y.o. male who presents for evaluation of dizziness. He has been having them for about a year, has approx 2 a week. If he lays on back or right side he will get light headed. It does not happen if he lays on left side. A few times its been so bad when he is up walking and will lean to the right. Feels unsteady. No symptoms of room spinning or nausea. Not sure if its related, but wanted to mention  he has been laying in bed and 2 different times started talking gibberish and nobody could understand him. The last episode was 2 years ago. Once he was made aware of this he started talking coherently. Doesn't know if he was just really tired or what was going on at the time. No ear pain. He works in a [employer] and also [employer] sometimes.   Physical Exam: Vitals:   10/22/22 1102  Resp: 16  SpO2: 96%  Weight: 182 lb (82.6 kg)  Height: 5\' 9"  (1.753 m)     Physical Exam Constitutional:      Appearance: He is normal weight. He is not ill-appearing.  Cardiovascular:     Rate and Rhythm: Normal rate and regular rhythm.     Heart sounds: Normal heart sounds. No murmur heard.    Comments: No Carotid Bruit Musculoskeletal:     Right lower leg: No edema.     Left lower leg: No edema.  Skin:    Findings: Rash (erythamatous plaque) present.  Neurological:     General: No focal deficit present.     Mental Status: He is alert and oriented to person, place, and time.      Assessment & Plan:   Aaron Nash was seen today for dizzy spells and concerns.  Tinea cruris Assessment & Plan: Tinea cruris right inner thigh. Treat with topical antifungal for 2 weeks Follow up if symptoms worsen or fail to improve   Orders: -     Terbinafine HCl; Apply 1 Application topically 2 (two) times daily.  Dispense: 30 g; Refill: 0  Non-recurrent acute suppurative otitis media of both ears without spontaneous rupture of tympanic membranes Assessment & Plan: Bilateral ear  infection. Take Augmentin as prescribed Follow up if symptoms worsen or fail to improve   Orders: -     Amoxicillin-Pot Clavulanate; Take 1 tablet by mouth 2 (two) times daily.  Dispense: 20 tablet; Refill: 0  Dizziness Assessment & Plan: Patient's dizziness is episodic.  Most of the time lasting minutes. No symptoms today. Orthostatic vital signs normal.  He has never had a syncopal event.  On exam he has a bilateral ear infection.  He is being treated for B12 deficiency. BMP last month normal. We will treat his ear infection but I am not sure if this explains 2 years of symptoms. Patient will follow up if not improving with treatment of ear infection.       Lorene Dy, MD

## 2022-10-22 NOTE — Assessment & Plan Note (Addendum)
Patient's dizziness is episodic.  Most of the time lasting minutes. No symptoms today. Orthostatic vital signs normal.  He has never had a syncopal event.  On exam he has a bilateral ear infection.  He is being treated for B12 deficiency. BMP last month normal. We will treat his ear infection but I am not sure if this explains 2 years of symptoms. Patient will follow up if not improving with treatment of ear infection.

## 2022-11-08 ENCOUNTER — Ambulatory Visit (INDEPENDENT_AMBULATORY_CARE_PROVIDER_SITE_OTHER): Payer: 59 | Admitting: Gastroenterology

## 2022-11-08 ENCOUNTER — Encounter (INDEPENDENT_AMBULATORY_CARE_PROVIDER_SITE_OTHER): Payer: Self-pay | Admitting: Gastroenterology

## 2022-11-08 VITALS — BP 111/71 | HR 75 | Temp 97.9°F | Ht 69.0 in | Wt 183.9 lb

## 2022-11-08 DIAGNOSIS — K621 Rectal polyp: Secondary | ICD-10-CM | POA: Diagnosis not present

## 2022-11-08 DIAGNOSIS — C19 Malignant neoplasm of rectosigmoid junction: Secondary | ICD-10-CM | POA: Diagnosis not present

## 2022-11-08 NOTE — Progress Notes (Signed)
Aaron Nash, M.D. Gastroenterology & Hepatology [facility] [address] [place], [place] [address]  Primary Care Physician: Johnette Abraham, MD [place] [address]  I will communicate my assessment and recommendations to the referring MD via EMR.  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 male with past medical history of rectal cancer status post low anterior resection in 2019, who presents for follow up of rectal cancer.  The patient was last seen on 10/27/2019 by Dr. Laural Golden. At that time, the patient was given a prescription for Bentyl 3 times daily.  Per notes of the report of the low anterior resection he underwent on 06/25/2018 Pathology reveals #1 tubular adenoma with high-grade dysplasia at proximal margin. #2 benign colonic epithelium and distal margin. 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 23 lymph nodes were examined and no metastatic disease noted.   The patient underwent a colonoscopy on 11/11/2019 WITH DR. Rehman.  Was found to have 2 polyps at the hepatic flexure and ascending colon, 1 polyp in the distal rectum, there was a patent end-to-end colorectal anastomosis, there was a large polyp in the mid rectum which was removed piecemeal using a hot snare with incomplete resection with hot snare as there was presence of poor lifting.  To stop active bleeding, a clip was placed.  Remaining tissue was ablated with APC.  Pathology of the rectal polyp was consistent with tubular and tubulovillous adenoma with focal high-grade dysplasia.  The other polyps were tubular adenoma x 3 and hyperplastic polyp.  This was followed by flexible sigmoidoscopy on 03/31/2020 which showed a large polyp in the rectum.  This could  not be removed given poor prep.  He underwent repeat flexible sigmoidoscopy on 06/09/2020, a large polyp was found in the mid rectum which was removed in a piecemeal technique with a hot snare but some of the tissue remained in the rectum.  This was ablated with APC.  Per note, the patient was not completely treated. Pathology consistent with a tubulovillous adenoma with focal high-grade dysplasia.  Polyp measured up to 3 cm.  Patient was referred to Dr. Johney Maine at [facility] surgery for evaluation of possible proctectomy.  Patient underwent partial proctectomy by transanal minimally invasive surgery on 09/21/2020.  Pathology came back consistent for a tubulovillous adenoma with high-grade dysplasia measuring 3.7 cm x 2.5 x 1.3 cm.  The patient denies having any complaints.  Has not presented any nausea, vomiting, fever, chills, hematochezia, melena, hematemesis, abdominal distention, abdominal pain, diarrhea, jaundice, pruritus or weight loss.   Past Medical History: Past Medical History:  Diagnosis Date   Atypical mole 01/08/2019   mild atypia on left upper back (free)   Atypical mole 01/08/2019   mild atypia on lower right back (free)   Basal cell carcinoma 12/12/2017   pigmented on right forehead [facility])   Cancer    skin cancer, basal cell   Chronic diarrhea    Colon cancer 2019   COVID-19 virus infection 03/2020   COVID-19 virus infection 08/2020   History of colonic polyps 06/2018   Patient states that there was numerous polyps and told one had a cancer seed in it.   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   Right Chest Inferior (tx p bx)   SCCA (squamous cell carcinoma)  of skin 01/12/2020   Left Forearm-Posterior (Keratoacanthoma) currette, cautery, 40fu    Past Surgical History: Past Surgical History:  Procedure Laterality Date   COLONOSCOPY  06/2018   Dr.Cathey in [place]   COLONOSCOPY N/A 11/11/2019   Procedure: COLONOSCOPY;  Surgeon: Rogene Houston, MD;  [facility];  Service: Endoscopy;  Laterality: N/A;  [place] N/A 03/31/2020   Procedure: FLEXIBLE SIGMOIDOSCOPY;  Surgeon: Rogene Houston, MD;  [facility];  Service: Endoscopy;  Laterality: N/A;  200, per office pt knows new arrival time 6:30   FLEXIBLE SIGMOIDOSCOPY N/A 06/09/2020   Procedure: FLEXIBLE SIGMOIDOSCOPY;  Surgeon: Rogene Houston, MD;  [facility];  Service: Endoscopy;  Laterality: N/A;  730,moved to 11/4 @ 1:00, pt test +on 8/31, does not need covid test < 90 days   FRACTURE SURGERY Left    leg - injury as a child   LOW ANTERIOR BOWEL RESECTION  06/27/2018   Rectal polyps - one with Tis cancer - [facility] - Dr Ladona Horns   POLYPECTOMY  11/11/2019   Procedure: POLYPECTOMY;  Surgeon: Rogene Houston, MD;  [facility];  Service: Endoscopy;;  Hepatic and ascending colon polyp biopsy forcep,  rectal polyp distal to anastamosis, large rectal polyp (piecemeal) hot snare.   POLYPECTOMY  06/09/2020   Procedure: POLYPECTOMY;  Surgeon: Rogene Houston, MD;  [facility];  Service: Endoscopy;;   XI ROBOT ASSISTED TRANSANAL RESECTION N/A 09/21/2020   Procedure: RECURRENT RECTAL POLYP PARTIAL PROCTECTOMY AND ROBOTIC TAMIS;  Surgeon: Michael Boston, MD;  [facility];  Service: General;  Laterality: N/A;    Family History: Family History  Problem Relation Age of Onset   Osteoporosis Mother    Hypertension Father    Healthy Sister     Social History: Social History   Tobacco Use  Smoking Status Former   Types: Cigarettes   Quit date: 10/27/2014   Years since quitting: 8.0  Smokeless Tobacco Never   Social History   Substance and Sexual Activity  Alcohol Use Yes   Comment: Per patient rarely   Social History   Substance and Sexual Activity  Drug Use Never    Allergies: No Known Allergies  Medications: Current Outpatient Medications  Medication Sig Dispense Refill   sildenafil (REVATIO) 20 MG  tablet Take 60 mg by mouth daily as needed for erectile dysfunction.     terbinafine (LAMISIL) 1 % cream Apply 1 Application topically 2 (two) times daily. 30 g 0   No current facility-administered medications for this visit.    Review of Systems: GENERAL: negative for malaise, night sweats HEENT: No changes in hearing or vision, no nose bleeds or other nasal problems. NECK: Negative for lumps, goiter, pain and significant neck swelling RESPIRATORY: Negative for cough, wheezing CARDIOVASCULAR: Negative for chest pain, leg swelling, palpitations, orthopnea GI: SEE HPI MUSCULOSKELETAL: Negative for joint pain or swelling, back pain, and muscle pain. SKIN: Negative for lesions, rash PSYCH: Negative for sleep disturbance, mood disorder and recent psychosocial stressors. HEMATOLOGY Negative for prolonged bleeding, bruising easily, and swollen nodes. ENDOCRINE: Negative for cold or heat intolerance, polyuria, polydipsia and goiter. NEURO: negative for tremor, gait imbalance, syncope and seizures. The remainder of the review of systems is noncontributory.   Physical Exam: BP 111/71 (BP Location: Left Arm, Patient Position: Sitting, Cuff Size: Normal)   Pulse 75   Temp 97.9 F (36.6 C) (Temporal)   Ht 5\' 9"  (1.753  m)   Wt 183 lb 14.4 oz (83.4 kg)   BMI 27.16 kg/m  GENERAL: The patient is AO x3, in no acute distress. HEENT: Head is normocephalic and atraumatic. EOMI are intact. Mouth is well hydrated and without lesions. NECK: Supple. No masses LUNGS: Clear to auscultation. No presence of rhonchi/wheezing/rales. Adequate chest expansion HEART: RRR, normal s1 and s2. ABDOMEN: Soft, nontender, no guarding, no peritoneal signs, and nondistended. BS +. No masses. EXTREMITIES: Without any cyanosis, clubbing, rash, lesions or edema. NEUROLOGIC: AOx3, no focal motor deficit. SKIN: no jaundice, no rashes  Imaging/Labs: as above  I personally reviewed and interpreted the available labs,  imaging and endoscopic files.  Impression and Plan: Aaron Nash is a 57 y.o. male with past medical history of rectal cancer status post low anterior resection in 2019, who presents for follow up of rectal cancer.  The patient has been asymptomatic.  Underwent successful LAR and subsequent TAMIS for recurrent tubulovillous adenoma with high-grade dysplasia.  We discussed that he is at high risk for colorectal cancer recurrence, he is due for colon be which will be scheduled today.  Patient understood and agreed.  - Schedule colonoscopy  All questions were answered.      Aaron Peppers, MD Gastroenterology and Hepatology [facility]

## 2022-11-08 NOTE — Patient Instructions (Signed)
Schedule colonoscopy

## 2022-11-09 ENCOUNTER — Telehealth (INDEPENDENT_AMBULATORY_CARE_PROVIDER_SITE_OTHER): Payer: Self-pay | Admitting: Gastroenterology

## 2022-11-09 MED ORDER — PEG 3350-KCL-NA BICARB-NACL 420 G PO SOLR: 4000.0000 mL | Freq: Once | ORAL | 0 refills | Status: AC

## 2022-11-09 NOTE — Addendum Note (Signed)
Addended by: Marlowe Shores on: 11/09/2022 08:55 AM   Modules accepted: Orders

## 2022-11-09 NOTE — Telephone Encounter (Signed)
Pt contacted and colonoscopy scheduled. Prep sent to pharmacy. Instructions sent via my chart.

## 2022-11-15 ENCOUNTER — Ambulatory Visit: Payer: 59 | Admitting: Internal Medicine

## 2022-11-15 ENCOUNTER — Encounter: Payer: Self-pay | Admitting: Internal Medicine

## 2022-11-15 VITALS — BP 111/64 | HR 87 | Ht 69.0 in | Wt 180.6 lb

## 2022-11-15 DIAGNOSIS — R7303 Prediabetes: Secondary | ICD-10-CM | POA: Diagnosis not present

## 2022-11-15 DIAGNOSIS — E538 Deficiency of other specified B group vitamins: Secondary | ICD-10-CM | POA: Diagnosis not present

## 2022-11-15 DIAGNOSIS — Z85828 Personal history of other malignant neoplasm of skin: Secondary | ICD-10-CM

## 2022-11-15 DIAGNOSIS — E782 Mixed hyperlipidemia: Secondary | ICD-10-CM | POA: Insufficient documentation

## 2022-11-15 MED ORDER — ATORVASTATIN CALCIUM 20 MG PO TABS
20.0000 mg | ORAL_TABLET | Freq: Every day | ORAL | 3 refills | Status: DC
Start: 2022-11-15 — End: 2022-11-26

## 2022-11-15 NOTE — Assessment & Plan Note (Signed)
Previously referred to dermatology but states that he has not been contacted by anyone to schedule appointment.  We will follow-up on the status of this referral today.

## 2022-11-15 NOTE — Patient Instructions (Signed)
It was a pleasure to see you today.  Thank you for giving [place] the opportunity to be involved in your care.  Below is a brief recap of your visit and next steps.  We will plan to see you again in 6 months.  Summary No medication changes today Repeat your cholesterol panel and vitamin B 12 level Follow up in 6 months

## 2022-11-15 NOTE — Assessment & Plan Note (Signed)
Lipid panel updated in January.  Total cholesterol 204 and LDL 136.  His 10-year ASCVD risk today is 11.6%.  Atorvastatin 20 mg daily was prescribed in January.  He has been taking this medication and has not experienced any adverse side effects. -Repeat lipid ordered today -Atorvastatin has been refilled

## 2022-11-15 NOTE — Assessment & Plan Note (Signed)
Noted on labs from January.  He is currently on daily vitamin B12 supplementation. -Repeat vitamin B12 level ordered today

## 2022-11-15 NOTE — Progress Notes (Signed)
Established Patient Office Visit  Subjective   Patient ID: Aaron Nash, male    DOB: June 21, 1966  Age: 57 y.o. MRN: [id number]  Chief Complaint  Patient presents with   Hyperlipidemia    Follow up   Mr. Aaron Nash returns to care today for 92-month follow-up.  He was last evaluated by me on 1/11 as a new patient presenting to establish care.  In the interim he was evaluated for an acute visit on 3/18 and treated for bilateral acute otitis media and tinea cruris.  He has also been seen by gastroenterology for follow-up and will undergo repeat colonoscopy next month (5/8).  There have otherwise been no acute interval events.  Mr. Aaron Nash reports feeling well today.  He states that he no longer experiences episodes of dizziness and tinea cruris has resolved.  He has no additional concerns to discuss today.  Past Medical History:  Diagnosis Date   Atypical mole 01/08/2019   mild atypia on left upper back (free)   Atypical mole 01/08/2019   mild atypia on lower right back (free)   Basal cell carcinoma 12/12/2017   pigmented on right forehead [facility])   Cancer    skin cancer, basal cell   Chronic diarrhea    Colon cancer 2019   COVID-19 virus infection 03/2020   COVID-19 virus infection 08/2020   History of colonic polyps 06/2018   Patient states that there was numerous polyps and told one had a cancer seed in it.   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   Past Surgical History:  Procedure Laterality Date   COLONOSCOPY  06/2018   Dr.Cathey in [place]   COLONOSCOPY N/A 11/11/2019   Procedure: COLONOSCOPY;  Surgeon: Malissa Hippo, MD;  [facility];  Service: Endoscopy;  Laterality: N/A;  145   FLEXIBLE SIGMOIDOSCOPY N/A 03/31/2020   Procedure: FLEXIBLE SIGMOIDOSCOPY;  Surgeon: Malissa Hippo, MD;  Location: [facility];  Service: Endoscopy;  Laterality: N/A;  200, per office pt knows new arrival time 6:30   FLEXIBLE SIGMOIDOSCOPY N/A 06/09/2020   Procedure: FLEXIBLE SIGMOIDOSCOPY;  Surgeon: Malissa Hippo, MD;  [facility];  Service: Endoscopy;  Laterality: N/A;  730,moved to 11/4 @ 1:00, pt test +on 8/31, does not need covid test < 90 days   FRACTURE SURGERY Left    leg - injury as a child   LOW ANTERIOR BOWEL RESECTION  06/27/2018   Rectal polyps - one with Tis cancer - [facility] - Dr Marcha Solders   POLYPECTOMY  11/11/2019   Procedure: POLYPECTOMY;  Surgeon: Malissa Hippo, MD;  [facility];  Service: Endoscopy;;  Hepatic and ascending colon polyp biopsy forcep,  rectal polyp distal to anastamosis, large rectal polyp (piecemeal) hot snare.   POLYPECTOMY  06/09/2020   Procedure: POLYPECTOMY;  Surgeon: Malissa Hippo, MD;  [facility];  Service: Endoscopy;;   XI ROBOT ASSISTED TRANSANAL RESECTION N/A 09/21/2020   Procedure: RECURRENT RECTAL POLYP PARTIAL PROCTECTOMY AND ROBOTIC TAMIS;  Surgeon: Karie Soda, MD;  Location: [facility];  Service: General;  Laterality: N/A;   Social History   Tobacco Use   Smoking status: Former    Types: Cigarettes    Quit date: 10/27/2014    Years since quitting: 8.0   Smokeless tobacco: Never  Vaping Use  Vaping Use: Never used  Substance Use Topics   Alcohol use: Yes    Comment: Per patient rarely   Drug use: Never   Family History  Problem Relation Age of Onset   Osteoporosis Mother    Hypertension Father    Healthy Sister    No Known Allergies  Review of Systems  Constitutional:  Negative for chills and fever.  HENT:  Negative for sore throat.   Respiratory:  Negative for cough and shortness of breath.   Cardiovascular:  Negative for chest pain, palpitations and leg swelling.  Gastrointestinal:  Negative for abdominal pain, blood in stool, constipation, diarrhea, nausea and vomiting.  Genitourinary:  Negative  for dysuria and hematuria.  Musculoskeletal:  Negative for myalgias.  Skin:  Negative for itching and rash.  Neurological:  Negative for dizziness and headaches.  Psychiatric/Behavioral:  Negative for depression and suicidal ideas.      Objective:     BP 111/64   Pulse 87   Ht 5\' 9"  (1.753 m)   Wt 180 lb 9.6 oz (81.9 kg)   SpO2 96%   BMI 26.67 kg/m  BP Readings from Last 3 Encounters:  11/15/22 111/64  11/08/22 111/71  08/16/22 112/71   Physical Exam Vitals reviewed.  Constitutional:      General: He is not in acute distress.    Appearance: Normal appearance. He is not ill-appearing.  HENT:     Head: Normocephalic and atraumatic.     Right Ear: External ear normal.     Left Ear: External ear normal.     Nose: Nose normal. No congestion or rhinorrhea.     Mouth/Throat:     Mouth: Mucous membranes are moist.     Pharynx: Oropharynx is clear.  Eyes:     General: No scleral icterus.    Extraocular Movements: Extraocular movements intact.     Conjunctiva/sclera: Conjunctivae normal.     Pupils: Pupils are equal, round, and reactive to light.  Cardiovascular:     Rate and Rhythm: Normal rate and regular rhythm.     Pulses: Normal pulses.     Heart sounds: Normal heart sounds. No murmur heard. Pulmonary:     Effort: Pulmonary effort is normal.     Breath sounds: Normal breath sounds. No wheezing, rhonchi or rales.  Abdominal:     General: Abdomen is flat. Bowel sounds are normal. There is no distension.     Palpations: Abdomen is soft.     Tenderness: There is no abdominal tenderness.     Comments: Well-healed midline abdominal surgical incision  Musculoskeletal:        General: No swelling or deformity. Normal range of motion.     Cervical back: Normal range of motion.  Skin:    General: Skin is warm and dry.     Capillary Refill: Capillary refill takes less than 2 seconds.  Neurological:     General: No focal deficit present.     Mental Status: He is alert and  oriented to person, place, and time.     Motor: No weakness.  Psychiatric:        Mood and Affect: Mood normal.        Behavior: Behavior normal.        Thought Content: Thought content normal.   Last CBC Lab Results  Component Value Date   WBC 10.5 08/16/2022   HGB 15.1 08/16/2022   HCT 43.8 08/16/2022   MCV 85 08/16/2022   MCH 29.4 08/16/2022  RDW 13.3 08/16/2022   PLT 321 08/16/2022   Last metabolic panel Lab Results  Component Value Date   GLUCOSE 86 08/16/2022   NA 141 08/16/2022   K 4.9 08/16/2022   CL 104 08/16/2022   CO2 22 08/16/2022   BUN 16 08/16/2022   CREATININE 0.90 08/16/2022   EGFR 100 08/16/2022   CALCIUM 9.2 08/16/2022   PROT 6.7 08/16/2022   ALBUMIN 4.1 08/16/2022   LABGLOB 2.6 08/16/2022   AGRATIO 1.6 08/16/2022   BILITOT 0.6 08/16/2022   ALKPHOS 84 08/16/2022   AST 14 08/16/2022   ALT 11 08/16/2022   ANIONGAP 6 09/22/2020   Last lipids Lab Results  Component Value Date   CHOL 204 (H) 08/16/2022   HDL 38 (L) 08/16/2022   LDLCALC 136 (H) 08/16/2022   TRIG 165 (H) 08/16/2022   CHOLHDL 5.4 (H) 08/16/2022   Last hemoglobin A1c Lab Results  Component Value Date   HGBA1C 6.0 (H) 08/16/2022   Last thyroid functions Lab Results  Component Value Date   TSH 0.951 08/16/2022   Last vitamin D Lab Results  Component Value Date   VD25OH 27.6 (L) 08/16/2022   Last vitamin B12 and Folate Lab Results  Component Value Date   VITAMINB12 204 (L) 08/16/2022   FOLATE 10.6 08/16/2022   The 10-year ASCVD risk score (Arnett DK, et al., 2019) is: 11.6%    Assessment & Plan:   Problem List Items Addressed This Visit       History of basal cell carcinoma    Previously referred to dermatology but states that he has not been contacted by anyone to schedule appointment.  We will follow-up on the status of this referral today.      Mixed hyperlipidemia - Primary    Lipid panel updated in January.  Total cholesterol 204 and LDL 136.  His 10-year  ASCVD risk today is 11.6%.  Atorvastatin 20 mg daily was prescribed in January.  He has been taking this medication and has not experienced any adverse side effects. -Repeat lipid ordered today -Atorvastatin has been refilled      Vitamin B12 deficiency    Noted on labs from January.  He is currently on daily vitamin B12 supplementation. -Repeat vitamin B12 level ordered today      Prediabetes    Noted on labs from January.  We again reviewed lifestyle modifications aimed at lowering his average blood sugar.       Return in about 6 months (around 05/17/2023).    Billie Lade, MD

## 2022-11-15 NOTE — Assessment & Plan Note (Signed)
Noted on labs from January.  We again reviewed lifestyle modifications aimed at lowering his average blood sugar.

## 2022-11-23 DIAGNOSIS — E782 Mixed hyperlipidemia: Secondary | ICD-10-CM | POA: Diagnosis not present

## 2022-11-23 DIAGNOSIS — E538 Deficiency of other specified B group vitamins: Secondary | ICD-10-CM | POA: Diagnosis not present

## 2022-11-24 LAB — LIPID PANEL
Chol/HDL Ratio: 5 ratio (ref 0.0–5.0)
Cholesterol, Total: 170 mg/dL (ref 100–199)
HDL: 34 mg/dL — ABNORMAL LOW (ref >39)
LDL Chol Calc (NIH): 116 mg/dL — ABNORMAL HIGH (ref 0–99)
Triglycerides: 108 mg/dL (ref 0–149)
VLDL Cholesterol Cal: 20 mg/dL (ref 5–40)

## 2022-11-24 LAB — B12 AND FOLATE PANEL
Folate: 7.8 ng/mL (ref >3.0)
Vitamin B-12: 191 pg/mL — ABNORMAL LOW (ref 232–1245)

## 2022-11-26 ENCOUNTER — Other Ambulatory Visit: Payer: Self-pay | Admitting: Internal Medicine

## 2022-11-26 DIAGNOSIS — E782 Mixed hyperlipidemia: Secondary | ICD-10-CM

## 2022-11-26 MED ORDER — ATORVASTATIN CALCIUM 40 MG PO TABS
40.0000 mg | ORAL_TABLET | Freq: Every day | ORAL | 3 refills | Status: DC
Start: 2022-11-26 — End: 2022-12-17

## 2022-12-10 ENCOUNTER — Telehealth (INDEPENDENT_AMBULATORY_CARE_PROVIDER_SITE_OTHER): Payer: Self-pay | Admitting: Gastroenterology

## 2022-12-10 NOTE — Telephone Encounter (Signed)
Pt step mother Aaron Nash calling in and states that pt does not have instructions. Instructions were sent to pt my chart on 11/09/22. Pt mom states pt does not use my chart. When I scheduled pt he stated to send instructions to my chart.  Emailed instructions to pt mom at [email or web address].

## 2022-12-12 ENCOUNTER — Ambulatory Visit (HOSPITAL_COMMUNITY): Payer: 59 | Admitting: Anesthesiology

## 2022-12-12 ENCOUNTER — Ambulatory Visit (HOSPITAL_BASED_OUTPATIENT_CLINIC_OR_DEPARTMENT_OTHER): Payer: 59 | Admitting: Anesthesiology

## 2022-12-12 ENCOUNTER — Telehealth (INDEPENDENT_AMBULATORY_CARE_PROVIDER_SITE_OTHER): Payer: Self-pay | Admitting: Gastroenterology

## 2022-12-12 ENCOUNTER — Ambulatory Visit (HOSPITAL_COMMUNITY)
Admission: RE | Admit: 2022-12-12 | Discharge: 2022-12-12 | Disposition: A | Discharge status: Home / Self Care | Payer: 59 | Attending: Gastroenterology | Admitting: Gastroenterology

## 2022-12-12 ENCOUNTER — Encounter (HOSPITAL_COMMUNITY)
Admission: RE | Disposition: A | Discharge status: Home / Self Care | Payer: Self-pay | Source: Home / Self Care | Attending: Gastroenterology

## 2022-12-12 ENCOUNTER — Other Ambulatory Visit (INDEPENDENT_AMBULATORY_CARE_PROVIDER_SITE_OTHER): Payer: Self-pay | Admitting: Gastroenterology

## 2022-12-12 ENCOUNTER — Encounter (HOSPITAL_COMMUNITY): Payer: Self-pay | Admitting: Gastroenterology

## 2022-12-12 ENCOUNTER — Other Ambulatory Visit: Payer: Self-pay

## 2022-12-12 DIAGNOSIS — Z85038 Personal history of other malignant neoplasm of large intestine: Secondary | ICD-10-CM

## 2022-12-12 DIAGNOSIS — Z1211 Encounter for screening for malignant neoplasm of colon: Secondary | ICD-10-CM | POA: Insufficient documentation

## 2022-12-12 DIAGNOSIS — F17210 Nicotine dependence, cigarettes, uncomplicated: Secondary | ICD-10-CM | POA: Insufficient documentation

## 2022-12-12 DIAGNOSIS — Z85048 Personal history of other malignant neoplasm of rectum, rectosigmoid junction, and anus: Secondary | ICD-10-CM | POA: Diagnosis not present

## 2022-12-12 DIAGNOSIS — Z538 Procedure and treatment not carried out for other reasons: Secondary | ICD-10-CM | POA: Diagnosis not present

## 2022-12-12 DIAGNOSIS — Z91148 Patient's other noncompliance with medication regimen for other reason: Secondary | ICD-10-CM

## 2022-12-12 DIAGNOSIS — F17200 Nicotine dependence, unspecified, uncomplicated: Secondary | ICD-10-CM | POA: Diagnosis not present

## 2022-12-12 DIAGNOSIS — Z980 Intestinal bypass and anastomosis status: Secondary | ICD-10-CM | POA: Insufficient documentation

## 2022-12-12 HISTORY — PX: FLEXIBLE SIGMOIDOSCOPY: SHX5431

## 2022-12-12 SURGERY — SIGMOIDOSCOPY, FLEXIBLE
Anesthesia: General

## 2022-12-12 MED ORDER — PROPOFOL 500 MG/50ML IV EMUL
INTRAVENOUS | Status: AC
  Filled 2022-12-12: qty 50

## 2022-12-12 MED ORDER — LACTATED RINGERS IV SOLN: INTRAVENOUS | Status: DC

## 2022-12-12 MED ORDER — PEG 3350-KCL-NA BICARB-NACL 420 G PO SOLR: 4000.0000 mL | Freq: Once | ORAL | 0 refills | Status: AC

## 2022-12-12 MED ORDER — LIDOCAINE HCL (PF) 2 % IJ SOLN
INTRAMUSCULAR | Status: DC | PRN
  Administered 2022-12-12: 80 mg

## 2022-12-12 MED ORDER — LIDOCAINE HCL (PF) 2 % IJ SOLN: INTRAMUSCULAR | Status: DC | PRN

## 2022-12-12 MED ORDER — PROPOFOL 10 MG/ML IV BOLUS
INTRAVENOUS | Status: DC | PRN
  Administered 2022-12-12: 50 mg via INTRAVENOUS

## 2022-12-12 MED ORDER — ATROPINE SULFATE 1 MG/ML IV SOLN
INTRAVENOUS | Status: AC
  Filled 2022-12-12: qty 1

## 2022-12-12 MED ORDER — LIDOCAINE HCL (PF) 2 % IJ SOLN
INTRAMUSCULAR | Status: AC
  Filled 2022-12-12: qty 5

## 2022-12-12 MED ORDER — PROPOFOL 500 MG/50ML IV EMUL
INTRAVENOUS | Status: DC | PRN
  Administered 2022-12-12: 125 ug/kg/min via INTRAVENOUS

## 2022-12-12 NOTE — H&P (Signed)
Aaron Nash is an 57 y.o. male.   Chief Complaint: rectal cancer HPI: Aaron Nash is a 57 y.o. male with past medical history of rectal cancer status post low anterior resection in 2019, who presents for follow up of rectal cancer.   The patient denies having any nausea, vomiting, fever, chills, hematochezia, melena, hematemesis, abdominal distention, abdominal pain, diarrhea, jaundice, pruritus or weight loss.   Past Medical History:  Diagnosis Date   Atypical mole 01/08/2019   mild atypia on left upper back (free)   Atypical mole 01/08/2019   mild atypia on lower right back (free)   Basal cell carcinoma 12/12/2017   pigmented on right forehead ([facility])   Cancer (HCC)    skin cancer, basal cell   Chronic diarrhea    Colon cancer (HCC) 2019   COVID-19 virus infection 03/2020   COVID-19 virus infection 08/2020   History of colonic polyps 06/2018   Patient states that there was numerous polyps and told one had a cancer seed in it.   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    Past Surgical History:  Procedure Laterality Date   COLONOSCOPY  06/2018   Dr.Cathey in [place]   COLONOSCOPY N/A 11/11/2019   Procedure: COLONOSCOPY;  Surgeon: Malissa Hippo, MD;  [facility];  Service: Endoscopy;  Laterality: N/A;  145   FLEXIBLE SIGMOIDOSCOPY N/A 03/31/2020   Procedure: FLEXIBLE SIGMOIDOSCOPY;  Surgeon: Malissa Hippo, MD;  [facility];  Service: Endoscopy;  Laterality: N/A;  200, per office pt knows new arrival time 6:30   FLEXIBLE SIGMOIDOSCOPY N/A 06/09/2020   Procedure: FLEXIBLE SIGMOIDOSCOPY;  Surgeon: Malissa Hippo, MD;  [facility];  Service: Endoscopy;  Laterality: N/A;  730,moved to 11/4 @ 1:00, pt test +on 8/31, does not need covid test < 90 days   FRACTURE SURGERY Left     leg - injury as a child   LOW ANTERIOR BOWEL RESECTION  06/27/2018   Rectal polyps - one with Tis cancer - [facility] - Dr Marcha Solders   POLYPECTOMY  11/11/2019   Procedure: POLYPECTOMY;  Surgeon: Malissa Hippo, MD;  [facility];  Service: Endoscopy;;  Hepatic and ascending colon polyp biopsy forcep,  rectal polyp distal to anastamosis, large rectal polyp (piecemeal) hot snare.   POLYPECTOMY  06/09/2020   Procedure: POLYPECTOMY;  Surgeon: Malissa Hippo, MD;  [facility];  Service: Endoscopy;;   XI ROBOT ASSISTED TRANSANAL RESECTION N/A 09/21/2020   Procedure: RECURRENT RECTAL POLYP PARTIAL PROCTECTOMY AND ROBOTIC TAMIS;  Surgeon: Karie Soda, MD;  Location: [facility];  Service: General;  Laterality: N/A;    Family History  Problem Relation Age of Onset   Osteoporosis Mother    Hypertension Father    Healthy Sister    Social History:  reports that he has been smoking cigarettes. He has never used smokeless tobacco. He reports current alcohol use. He reports that he does not use drugs.  Allergies: No Known Allergies  Medications Prior to Admission  Medication Sig Dispense Refill   atorvastatin (LIPITOR) 20 MG tablet Take 40 mg by mouth daily.     sildenafil (REVATIO) 20 MG tablet Take 40 mg by mouth daily as needed for erectile dysfunction.     atorvastatin (LIPITOR) 40 MG tablet Take 1 tablet (40 mg  total) by mouth daily. (Patient not taking: Reported on 12/11/2022) 90 tablet 3    No results found for this or any previous visit (from the past 48 hour(s)). No results found.  Review of Systems  All other systems reviewed and are negative.   Blood pressure 120/73, pulse 61, temperature 97.8 F (36.6 C), temperature source Oral, resp. rate 15, height 5\' 9"  (1.753 m), weight 78 kg, SpO2 99 %. Physical Exam  GENERAL: The patient is AO x3, in no acute distress. HEENT: Head is normocephalic and atraumatic. EOMI are intact. Mouth is well hydrated and without  lesions. NECK: Supple. No masses LUNGS: Clear to auscultation. No presence of rhonchi/wheezing/rales. Adequate chest expansion HEART: RRR, normal s1 and s2. ABDOMEN: Soft, nontender, no guarding, no peritoneal signs, and nondistended. BS +. No masses. EXTREMITIES: Without any cyanosis, clubbing, rash, lesions or edema. NEUROLOGIC: AOx3, no focal motor deficit. SKIN: no jaundice, no rashes  Assessment/Plan Aaron Nash is a 57 y.o. male with past medical history of rectal cancer status post low anterior resection in 2019, who presents for follow up of rectal cancer. Will proceed with colonoscopy  Dolores Frame, MD 12/12/2022, 10:15 AM

## 2022-12-12 NOTE — Telephone Encounter (Signed)
Per [employer] Procedure Code 1 16109  Quantity 1 Units  Procedure From - To Date 2022-12-13  Status NO AUTH REQUIRED  Message No precert required. The requested service may not be eligible for coverage . Refer to the online Clinical Policy Bulletins or the Provider Code Search Tool on [employer]. You may also contact provider services using the Precert number on the member id card. [id number]

## 2022-12-12 NOTE — Anesthesia Preprocedure Evaluation (Signed)
Anesthesia Evaluation  Patient identified by MRN, date of birth, ID band Patient awake    Reviewed: Allergy & Precautions, H&P , NPO status , Patient's Chart, lab work & pertinent test results, reviewed documented beta blocker date and time   Airway Mallampati: II  TM Distance: >3 FB Neck ROM: full    Dental no notable dental hx.    Pulmonary neg pulmonary ROS, Current Smoker   Pulmonary exam normal breath sounds clear to auscultation       Cardiovascular Exercise Tolerance: Good negative cardio ROS  Rhythm:regular Rate:Normal     Neuro/Psych negative neurological ROS  negative psych ROS   GI/Hepatic negative GI ROS, Neg liver ROS,,,  Endo/Other  negative endocrine ROS    Renal/GU negative Renal ROS  negative genitourinary   Musculoskeletal   Abdominal   Peds  Hematology negative hematology ROS (+)   Anesthesia Other Findings   Reproductive/Obstetrics negative OB ROS                             Anesthesia Physical Anesthesia Plan  ASA: 2  Anesthesia Plan: General   Post-op Pain Management:    Induction:   PONV Risk Score and Plan: Propofol infusion  Airway Management Planned:   Additional Equipment:   Intra-op Plan:   Post-operative Plan:   Informed Consent: I have reviewed the patients History and Physical, chart, labs and discussed the procedure including the risks, benefits and alternatives for the proposed anesthesia with the patient or authorized representative who has indicated his/her understanding and acceptance.     Dental Advisory Given  Plan Discussed with: CRNA  Anesthesia Plan Comments:        Anesthesia Quick Evaluation  

## 2022-12-12 NOTE — Transfer of Care (Signed)
Immediate Anesthesia Transfer of Care Note  Patient: Aaron Nash  Procedure(s) Performed: FLEXIBLE SIGMOIDOSCOPY  Patient Location: Endoscopy Unit  Anesthesia Type:General  Level of Consciousness: awake  Airway & Oxygen Therapy: Patient Spontanous Breathing  Post-op Assessment: Report given to RN and Post -op Vital signs reviewed and stable  Post vital signs: Reviewed and stable  Last Vitals:  Vitals Value Taken Time  BP 90/58 12/12/22 1034  Temp 36.8 C 12/12/22 1034  Pulse 80   Resp 23 12/12/22 1034  SpO2 95 % 12/12/22 1034    Last Pain:  Vitals:   12/12/22 1034  TempSrc: Oral  PainSc: 0-No pain      Patients Stated Pain Goal: 10 (12/12/22 0837)  Complications: No notable events documented.

## 2022-12-12 NOTE — Telephone Encounter (Signed)
Pt stepmother called and left voicemail wanting a call back Returned call to [facility] (pt stepmother). She is wanting to know if pt could do any other prep to where he doesn't have to go so long with out eating. Pt states he can not go that long with out eating. Stepmother also stated that money was also an issue this morning as to why pt wants to hold off. Please advise. Thank you

## 2022-12-12 NOTE — Telephone Encounter (Signed)
Aaron Scrape, RN  Aaron Shores, LPN Per dr. Levon Hedger, and Eber Jones , this patient will have to be booked on the Endo 3 side by the office. Dr. Salena Saner wants patient to be scheduled at 9:15am., arrival at 7:15am  Strader, Aaron Heinz, RN  Aaron Shores, LPN This patient will have to be repeated tomorrow for colonoscopy due to incomplete prep.  Per Dr. Levon Hedger, please send in a new preparation and instructions for prep to patients pharmacy and prior authorization for procedure Thursday Dec 13, 2022. Please contact patient when order sent to pharmacy for prep.  Hi Aaron Nash, maybe endo reached before about him. we need to reschedule him for colonoscopy tomorrow , had poor prep today. Please send a new Golytely or Trilyte prep   DC Dx: rectal cancer  Placed pt on schedule for tomorrow and sent in [place]. Contacted pt. Pt states he would like to speak to someone about billing since he would be going twice. Gave pt number to pre service dept. Will complete PA on my end. Pt will call back with whether he wants to proceed or not.

## 2022-12-12 NOTE — Op Note (Addendum)
[facility] Patient Name: Aaron Nash Procedure Date: 12/12/2022 10:09 AM MRN: [id number] Date of Birth: 09-14-1965 Attending MD: Katrinka Blazing , , [id number] CSN: [id number] Age: 56 Admit Type: Outpatient Procedure:                Flexible Sigmoidoscopy Indications:              High risk colon cancer surveillance: Personal                            history of colon cancer Providers:                Katrinka Blazing, Sheran Fava, Lennice Sites                            Technician, Technician Referring MD:              Medicines:                Monitored Anesthesia Care Complications:            No immediate complications. Estimated Blood Loss:     Estimated blood loss: none. Procedure:                Pre-Anesthesia Assessment:                           - Prior to the procedure, a History and Physical                            was performed, and patient medications, allergies                            and sensitivities were reviewed. The patient's                            tolerance of previous anesthesia was reviewed.                           - The risks and benefits of the procedure and the                            sedation options and risks were discussed with the                            patient. All questions were answered and informed                            consent was obtained.                           - ASA Grade Assessment: II - A patient with mild                            systemic disease.                           After obtaining informed consent, the scope was  passed under direct vision. The [id number]-[id number]                            ([id number]) scope was introduced through the anus and                            advanced to the the rectosigmoid junction. After                            obtaining informed consent, the scope was passed                            under direct vision.The flexible sigmoidoscopy was                             performed with difficulty due to poor bowel prep.                            The patient tolerated the procedure well. The                            quality of the bowel preparation was poor. Scope In: 10:28:56 AM Scope Out: 10:30:44 AM Total Procedure Duration: 0 hours 1 minute 48 seconds  Findings:      The perianal and digital rectal examinations were normal.      A large amount of stool was found in the rectum and in the recto-sigmoid       colon, interfering with visualization.      There was evidence of a prior end-to-side colo-rectal anastomosis in the       rectum. This was patent and was characterized by healthy appearing       mucosa, with an associated tattoo. The anastomosis was traversed. Impression:               - Preparation of the colon was poor.                           - Stool in the rectum and in the recto-sigmoid                            colon.                           - Patent end-to-side colo-rectal anastomosis,                            characterized by healthy appearing mucosa. Tattoo                            was present.                           - No specimens collected. Moderate Sedation:      Per Anesthesia Care Recommendation:           - Discharge patient to home (ambulatory).                           -  Clear liquid diet today.                           - Will discuss proceeding with repeat colonoscopy                            tomorrow due to poor prep.                           ADDENDUM: Spoke with patient, who states he will                            hold off for now on rescheduling and will give [place] a                            call when he wants to reschedule - he will need a                            long prep. Procedure Code(s):        --- Professional ---                           E9528, 52, Colorectal cancer screening; flexible                            sigmoidoscopy Diagnosis Code(s):        --- Professional  ---                           [id number], Personal history of other malignant                            neoplasm of large intestine                           Z98.0, Intestinal bypass and anastomosis status CPT copyright 2022 American Medical Association. All rights reserved. The codes documented in this report are preliminary and upon coder review may  be revised to meet current compliance requirements. Katrinka Blazing, MD Katrinka Blazing,  12/12/2022 10:38:08 AM This report has been signed electronically. Number of Addenda: 0

## 2022-12-12 NOTE — Discharge Instructions (Addendum)
You are being discharged to home.  Take a clear liquid diet today.  Will discuss proceeding with repeat colonoscopy tomorrow due to poor prep or call office to reschedule.

## 2022-12-12 NOTE — Telephone Encounter (Signed)
Per Dr.Castaneda-please let him know to call to schedule when available, we will need to do a long prep (please ask me before giving him instructions)   Pt contacted and verbalized understanding

## 2022-12-12 NOTE — Telephone Encounter (Signed)
As he had a poor prep, he will unfortunately need to take a prep for at least couple of days and staying on full liquid diet two days before his procedure and clear liquid diet the day before.  Unfortunately, if we try less than that, the outcome will be the same as today - poor prep and aborting the procedure.

## 2022-12-13 ENCOUNTER — Encounter (INDEPENDENT_AMBULATORY_CARE_PROVIDER_SITE_OTHER): Payer: Self-pay

## 2022-12-13 NOTE — Telephone Encounter (Signed)
Left message to return call and also sent a my chart message.

## 2022-12-14 ENCOUNTER — Telehealth: Payer: Self-pay | Admitting: Internal Medicine

## 2022-12-14 ENCOUNTER — Other Ambulatory Visit: Payer: Self-pay

## 2022-12-14 NOTE — Anesthesia Postprocedure Evaluation (Signed)
Anesthesia Post Note  Patient: Aaron Nash  Procedure(s) Performed: FLEXIBLE SIGMOIDOSCOPY  Patient location during evaluation: Phase II Anesthesia Type: General Level of consciousness: awake Pain management: pain level controlled Vital Signs Assessment: post-procedure vital signs reviewed and stable Respiratory status: spontaneous breathing and respiratory function stable Cardiovascular status: blood pressure returned to baseline and stable Postop Assessment: no headache and no apparent nausea or vomiting Anesthetic complications: no Comments: Late entry   No notable events documented.   Last Vitals:  Vitals:   12/12/22 0837 12/12/22 1034  BP: 120/73 (!) 90/58  Pulse: 61 76  Resp: 15 (!) 23  Temp: 36.6 C 36.8 C  SpO2: 99% 95%    Last Pain:  Vitals:   12/12/22 1034  TempSrc: Oral  PainSc: 0-No pain                 Windell [place]

## 2022-12-14 NOTE — Telephone Encounter (Signed)
Prescription Request  12/14/2022  LOV: 11/15/2022  What is the name of the medication or equipment? atorvastatin (LIPITOR) 40 MG tablet [[id number]]    Have you contacted your pharmacy to request a refill? No   Which pharmacy would you like this sent to?  [facility] [place], [place] - [address] [place] [address] Phone: [phone] Fax: [phone]    Patient notified that their request is being sent to the clinical staff for review and that they should receive a response within 2 business days.   Please advise at Mobile [phone] (mobile)

## 2022-12-17 ENCOUNTER — Other Ambulatory Visit: Payer: Self-pay

## 2022-12-17 DIAGNOSIS — E782 Mixed hyperlipidemia: Secondary | ICD-10-CM

## 2022-12-17 MED ORDER — ATORVASTATIN CALCIUM 40 MG PO TABS
40.0000 mg | ORAL_TABLET | Freq: Every day | ORAL | 3 refills | Status: DC
Start: 2022-12-17 — End: 2024-02-05

## 2022-12-17 NOTE — Telephone Encounter (Signed)
Refill sent.

## 2022-12-18 ENCOUNTER — Encounter (HOSPITAL_COMMUNITY): Payer: Self-pay | Admitting: Gastroenterology

## 2023-01-07 ENCOUNTER — Ambulatory Visit (INDEPENDENT_AMBULATORY_CARE_PROVIDER_SITE_OTHER): Payer: 59

## 2023-01-07 DIAGNOSIS — E538 Deficiency of other specified B group vitamins: Secondary | ICD-10-CM

## 2023-01-07 MED ORDER — CYANOCOBALAMIN 1000 MCG/ML IJ SOLN
1000.0000 ug | Freq: Once | INTRAMUSCULAR | Status: AC
Start: 2023-01-07 — End: 2023-01-07
  Administered 2023-01-07: 1000 ug via INTRAMUSCULAR

## 2023-03-21 ENCOUNTER — Emergency Department: Admit: 2023-03-21 | Payer: TRICARE (CHAMPUS) | Primary: Family Medicine

## 2023-03-21 ENCOUNTER — Inpatient Hospital Stay
Admit: 2023-03-21 | Discharge: 2023-03-21 | Disposition: A | Discharge status: Home / Self Care | Payer: TRICARE (CHAMPUS) | Attending: Emergency Medicine

## 2023-03-21 DIAGNOSIS — S43005A Unspecified dislocation of left shoulder joint, initial encounter: Secondary | ICD-10-CM

## 2023-03-21 DIAGNOSIS — S43011A Anterior subluxation of right humerus, initial encounter: Secondary | ICD-10-CM

## 2023-03-21 DIAGNOSIS — Z743 Need for continuous supervision: Secondary | ICD-10-CM | POA: Diagnosis not present

## 2023-03-21 DIAGNOSIS — T148XXA Other injury of unspecified body region, initial encounter: Secondary | ICD-10-CM | POA: Diagnosis not present

## 2023-03-21 MED ORDER — LIDOCAINE HCL 1 % IJ SOLN
1 | Freq: Once | INTRAMUSCULAR | Status: AC
Start: 2023-03-21 — End: 2023-03-21
  Administered 2023-03-21: 22:00:00 5 mL via INTRADERMAL

## 2023-03-21 MED ORDER — METHOCARBAMOL 750 MG PO TABS
750 | ORAL_TABLET | Freq: Three times a day (TID) | ORAL | 0 refills | Status: AC
Start: 2023-03-21 — End: 2023-03-28

## 2023-03-21 MED ORDER — MIDAZOLAM HCL (PF) 5 MG/5ML IJ SOLN
5 | Freq: Once | INTRAMUSCULAR | Status: AC
Start: 2023-03-21 — End: 2023-03-21
  Administered 2023-03-21: 22:00:00 2 mg via INTRAVENOUS

## 2023-03-21 MED ORDER — ACETAMINOPHEN 500 MG PO TABS
500 | ORAL_TABLET | Freq: Four times a day (QID) | ORAL | 0 refills | Status: AC | PRN
Start: 2023-03-21 — End: 2023-03-28

## 2023-03-21 MED ORDER — MELOXICAM 15 MG PO TABS
15 | ORAL_TABLET | Freq: Every day | ORAL | 0 refills | Status: AC | PRN
Start: 2023-03-21 — End: ?

## 2023-03-21 MED ORDER — SODIUM CHLORIDE 0.9 % IV BOLUS
0.9 | Freq: Once | INTRAVENOUS | Status: AC
Start: 2023-03-21 — End: 2023-03-21
  Administered 2023-03-21: 21:00:00 1000 mL via INTRAVENOUS

## 2023-03-21 MED FILL — LIDOCAINE HCL 1 % IJ SOLN: 1 % | INTRAMUSCULAR | Qty: 20

## 2023-03-21 MED FILL — MIDAZOLAM HCL 5 MG/5ML IJ SOLN: 5 mg/mL | INTRAMUSCULAR | Qty: 5

## 2023-03-21 NOTE — Discharge Instructions (Signed)
Return to emergency department for worsening symptoms.  Keep immobilized for the next 5 to 7 days.  Recommend light range of motion stuff gently once daily.  Scheduled Tylenol 1 g every 8 hours.  Mobic take 15 mg once daily.  Robaxin take 750 mg up to every 8 hours avoid driving or operating machinery within 8 hours take medications.  Follow-up with orthopedics for reassessment within the next week

## 2023-03-21 NOTE — ED Provider Notes (Signed)
[facility] DEPT  EMERGENCY DEPARTMENT ENCOUNTER      Pt Name: Dale Pope  MRN: [id number]  Birthdate May 26, 1966  Date of evaluation: 03/21/2023  Provider: Florinda Marker, MD    CHIEF COMPLAINT       Chief Complaint   Patient presents with    Shoulder Injury     Left should dislocated following fall in back yard. Arrived to ER via EMS. Ems gave fentenyl 100 mcg and zofran 4 mg.  Full sensation in left hand. Left shoulder visibly dislocated at this time     HISTORY OF PRESENT ILLNESS    [name] is a 57 y.o. male history hyperlipidemia, prior shoulder dislocation who presents to the emergency department left shoulder injury. patient reports tripped on something outside attempted to break his fall  Denies neck pain with his bilateral arms when he felt his left shoulder gave way feels similar to his prior dislocation.  Unable to pop it back in himself.  Patient denies any other injury sustained denies head strike or loss of consciousness or blood thinners.  Denies neck pain, back pain, chest pain, abdominal pain, pain in his pelvis or pain in his other extremities.  Denies any open wounds.  Received 100 mcg of fentanyl route with EMS vital signs stable.      The history is provided by the patient.       Nursing Notes were reviewed.  REVIEW OF SYSTEMS     Review of Systems    Except as noted above the remainder of the review of systems was reviewed and negative.     PAST MEDICAL HISTORY     Past Medical History:   Diagnosis Date    Hypercholesteremia     Sleep apnea        SURGICAL HISTORY       Past Surgical History:   Procedure Laterality Date    ORTHOPEDIC SURGERY      UROLOGICAL SURGERY         CURRENT MEDICATIONS       Previous Medications    ASPIRIN 81 MG CHEWABLE TABLET    Take 81 mg by mouth daily    ATORVASTATIN (LIPITOR) 80 MG TABLET    Take 1 tablet by mouth daily    CETIRIZINE (ZYRTEC ALLERGY) 10 MG TABLET    1 tablet as needed Orally Once a day    CETIRIZINE HCL 10 MG CAPS    Take by  mouth    ONDANSETRON (ZOFRAN-ODT) 4 MG DISINTEGRATING TABLET    Take 4 mg by mouth every 8 hours as needed    ROSUVASTATIN (CRESTOR) 40 MG TABLET    Take 40 mg by mouth     ALLERGIES     Patient has no known allergies.  FAMILY HISTORY     No family history on file.   SOCIAL HISTORY       Social History     Socioeconomic History    Marital status: Married   Tobacco Use    Smoking status: Never    Smokeless tobacco: Never   Substance and Sexual Activity    Alcohol use: Yes    Drug use: Not Currently     SCREENINGS     Glasgow Coma Scale  Eye Opening: Spontaneous  Best Verbal Response: Oriented  Best Motor Response: Obeys commands  Glasgow Coma Scale Score: 15       CIWA Assessment  BP: 128/77  Pulse: 62  PHYSICAL EXAM       ED Triage Vitals [03/21/23 1716]   BP Systolic BP Percentile Diastolic BP Percentile Temp Temp Source Pulse Respirations SpO2   (!) 141/107 -- -- 98.8 F (37.1 C) Oral 62 18 98 %      Height Weight - Scale         1.88 m (6\' 2" ) 88.5 kg (195 lb)             Physical Exam  Vitals and nursing note reviewed.   Constitutional:       General: He is not in acute distress.     Appearance: Normal appearance. He is not ill-appearing.   HENT:      Head: Normocephalic and atraumatic.      Nose: Nose normal.   Eyes:      General: No scleral icterus.  Cardiovascular:      Rate and Rhythm: Normal rate and regular rhythm.   Pulmonary:      Effort: Pulmonary effort is normal. No respiratory distress.   Abdominal:      General: Abdomen is flat.   Musculoskeletal:         General: Tenderness, deformity and signs of injury present. No swelling.      Comments: Patient with obvious deformity to left shoulder.  Unable to range left shoulder distal pulses intact sensory intact.  No bony tenderness palpation throughout the left upper extremity.  No bony tenderness palpation throughout other extremities either.  Negative CTL or S spine tenderness to palpation.  Pelvis is stable.  No chest wall tenderness.    Skin:     Findings: No rash.   Neurological:      General: No focal deficit present.      Mental Status: He is alert and oriented to person, place, and time.      Cranial Nerves: No cranial nerve deficit.   Psychiatric:         Mood and Affect: Mood normal.         Behavior: Behavior normal.         DIAGNOSTIC RESULTS   EKG: All EKG's are interpreted by the Emergency Department Physician who either signs or Co-signs this chart in the absence of a cardiologist.    none, by my interpretation    RADIOLOGY:   Non-plain film images such as CT, Ultrasound and MRI are read by the radiologist. Plain radiographic images are visualized and preliminarily interpreted by the emergency physician with the below findings:    X-ray with anterior shoulder dislocation, per my interpretation    Interpretation per the Radiologist below, if available at the time of this note:  XR SHOULDER LEFT (MIN 2 VIEWS)   Final Result   Left glenohumeral joint dislocation appears to be successfully reduced although    evaluation is severely limited due to suboptimal patient positioning.      XR SHOULDER LEFT (MIN 2 VIEWS)   Final Result   Anterior glenohumeral dislocation.          LABS:  Labs Reviewed - No data to display    All other labs were within normal range or not returned as of this dictation.    EMERGENCY DEPARTMENT COURSE and DIFFERENTIAL DIAGNOSIS/MDM:   Vitals:    Vitals:    03/21/23 1735 03/21/23 1741 03/21/23 1800 03/21/23 1803   BP:   132/80 128/77   Pulse:       Resp:       Temp:  TempSrc:       SpO2: 96% 97% 97% 96%   Weight:       Height:           Differential diagnosis: Shoulder dislocation, Bankart's, proximal humerus fracture  Other testing considered:  Prior Inpatient/External notes reviewed:    Medical Decision Making  Dale Pope is a 57 y.o. male history hyperlipidemia, prior shoulder dislocation who presents to the emergency department left shoulder injury.  Left shoulder injury obvious deformity feels similar  to previous dislocation fall on outstretched hand denies any other injury sustained denies head strike or loss of consciousness.  See if 100 mcg of fentanyl and 4 of Zofran and route vital signs stable.  Denies any medication allergies.  Will give additional 2 mg of Versed and intra-articular injection with attempted reduction.  Happened approximately 1 hour ago.  Denies any other injury sustained.  X-ray left shoulder obtained    Amount and/or Complexity of Data Reviewed  Radiology: ordered and independent interpretation performed. Decision-making details documented in ED Course.    Risk  Prescription drug management.          REASSESSMENT      Reduction was symptoms technique patient tolerated well no noticeable click but does now have range of motion of shoulder without significant pain passively.  Shoulders appears to be well aligned with x-ray apparent improvement although suboptimal positioning.  Patient states pain is minimal mild soreness now bleed to be reduced.  Will place in sling and have follow-up with orthopedics as seen Dr. Krista Blue in the past.  Return precautions given        CONSULTS:  None    PROCEDURES:  Unless otherwise noted below, none     Ortho Injury    Date/Time: 03/21/2023 6:33 PM    Performed by: Florinda Marker, MD  Authorized by: Florinda Marker, MD  Consent: Verbal consent obtained.  Risks and benefits: risks, benefits and alternatives were discussed  Consent given by: patient  Patient understanding: patient states understanding of the procedure being performed  Patient consent: the patient's understanding of the procedure matches consent given  Procedure consent: procedure consent matches procedure scheduled  Relevant documents: relevant documents present and verified  Test results: test results available and properly labeled  Site marked: the operative site was marked  Imaging studies: imaging studies available  Required items: required blood products, implants, devices,  and special equipment available  Patient identity confirmed: verbally with patient, arm band, provided demographic data and hospital-assigned identification number  Time out: Immediately prior to procedure a "time out" was called to verify the correct patient, procedure, equipment, support staff and site/side marked as required.  Injury location: shoulder  Location details: left shoulder  Injury type: dislocation  Dislocation type: anterior  Hill-Sachs deformity: no  Chronicity: recurrent  Pre-procedure neurovascular assessment: neurovascularly intact  Pre-procedure distal perfusion: normal  Pre-procedure neurological function: normal  Pre-procedure range of motion: reduced    Anesthesia:  Local anesthesia used: yes  Local Anesthetic: lidocaine 1% without epinephrine  Anesthetic total: 10 mL    Sedation:  Patient sedated: no    Manipulation performed: yes  Reduction method: traction and counter traction  Reduction successful: yes  X-ray confirmed reduction: yes  Immobilization: sling  Post-procedure neurovascular assessment: post-procedure neurovascularly intact  Post-procedure distal perfusion: normal  Post-procedure neurological function: normal  Post-procedure range of motion: normal  Patient tolerance: patient tolerated the procedure well with no immediate complications  FINAL IMPRESSION      1. Dislocation of left shoulder joint, initial encounter          DISPOSITION/PLAN   DISPOSITION Decision To Discharge 03/21/2023 06:34:52 PM  Condition at Disposition: Good      PATIENT REFERRED TO:  [facility] EMERGENCY DEPT  [address] [place] [address]  [phone]  Go today  If symptoms worsen    Alene Mires, MD  [address]  [address]  [place] [address]  [phone]    Schedule an appointment as soon as possible for a visit         DISCHARGE MEDICATIONS:  New Prescriptions    ACETAMINOPHEN (TYLENOL) 500 MG TABLET    Take 2 tablets by mouth every 6 hours as needed for Pain  or Fever    MELOXICAM (MOBIC) 15 MG TABLET    Take 1 tablet by mouth daily as needed for Pain    METHOCARBAMOL (ROBAXIN-750) 750 MG TABLET    Take 1 tablet by mouth 3 times daily for 7 days Avoid driving or operating heavy machinery within 8 hours of taking this medication          No data to display                (Please note that portions of this note were completed with a voice recognition program.  Efforts were made to edit the dictations but occasionally words are mis-transcribed.)    Florinda Marker, MD (electronically signed)  Attending Emergency Physician       Jahmire Ruffins, Doy Mince, MD  03/21/23 [phone]

## 2023-03-22 NOTE — Telephone Encounter (Signed)
Spoke to patient and Dr. Krista Blue states he wants the patient to head back to the ER because his shoulder is not fully relocated. Patient states he is on his way to the ER now and Dr. Krista Blue states we will see this patient next week in office. Patient will call back to schedule an appt.

## 2023-03-22 NOTE — Telephone Encounter (Signed)
The patient went to [facility] ER 8/15 regarding dislocated left shoulder, xrays were taken.  He said they were not sure if they set it completely.  He is a prior patient of Dr.Demarco.

## 2023-03-22 NOTE — Telephone Encounter (Signed)
Called patient and left voicemail. If patient calls back please transfer to the [address] office

## 2023-03-26 DIAGNOSIS — Z20822 Contact with and (suspected) exposure to covid-19: Secondary | ICD-10-CM | POA: Diagnosis not present

## 2023-03-27 ENCOUNTER — Telehealth (INDEPENDENT_AMBULATORY_CARE_PROVIDER_SITE_OTHER): Payer: 59 | Admitting: Family Medicine

## 2023-03-27 ENCOUNTER — Encounter: Payer: Self-pay | Admitting: Family Medicine

## 2023-03-27 VITALS — Temp 102.6°F | Ht 69.0 in | Wt 170.0 lb

## 2023-03-27 DIAGNOSIS — R8290 Unspecified abnormal findings in urine: Secondary | ICD-10-CM | POA: Diagnosis not present

## 2023-03-27 DIAGNOSIS — T63301A Toxic effect of unspecified spider venom, accidental (unintentional), initial encounter: Secondary | ICD-10-CM | POA: Insufficient documentation

## 2023-03-27 MED ORDER — DOXYCYCLINE HYCLATE 100 MG PO TABS: 100.0000 mg | ORAL_TABLET | Freq: Two times a day (BID) | ORAL | 0 refills | Status: AC

## 2023-03-27 MED ORDER — PREDNISONE 20 MG PO TABS: 20.0000 mg | ORAL_TABLET | Freq: Two times a day (BID) | ORAL | 0 refills | Status: AC

## 2023-03-27 MED ORDER — ALBUTEROL SULFATE HFA 108 (90 BASE) MCG/ACT IN AERS: 2.0000 | INHALATION_SPRAY | Freq: Four times a day (QID) | RESPIRATORY_TRACT | 2 refills | Status: AC | PRN

## 2023-03-27 NOTE — Progress Notes (Signed)
Virtual Visit via Video Note  I connected with Aaron Nash on 03/27/23 at  3:00 PM EDT by a video enabled telemedicine application and verified that I am speaking with the correct person using two identifiers.  Patient Location: Home Provider Location: Office/Clinic  I discussed the limitations, risks, security, and privacy concerns of performing an evaluation and management service by video and the availability of in person appointments. I also discussed with the patient that there may be a patient responsible charge related to this service. The patient expressed understanding and agreed to proceed.  Subjective: PCP: Billie Lade, MD  Chief Complaint  Patient presents with   Ankle Injury    Ankle was bitten while mowing yard. Not sure by what. It is red and Feels hot to touch. .    weakness    Weakness with no appetite.    Urine Output    Pt. States urine has a smell to it.    Headache    Pt. Complains of headache x2 days.    Emesis    Vomiting x 2 days, sweating, UC visit stated that it was a virus and pt. Tested negative for covid twice.    Aaron Nash 57 year old male present via telehealth, Patient complains of fever and cough started on Saturday and gradually worsening . Patient describes symptoms of fatigue, shortness of breath at rest, cough, emesis, headache, malaise, myalgias, sore throat, and brown sputum production with foul smelling urine. Symptoms began 5 days ago and are gradually worsening since that time. Patient denies chest pain. Treatment thus far includes OTC analgesics/antipyretics: not very effective Past pulmonary history is significant for no history of pneumonia or bronchitis.Patient also had a spider bite on his right ankle, reports redness and scabbing on site. Recent Covid test negative       ROS: Per HPI  Current Outpatient Medications:    albuterol (VENTOLIN HFA) 108 (90 Base) MCG/ACT inhaler, Inhale 2 puffs into the lungs every 6  (six) hours as needed for wheezing or shortness of breath., Disp: 8 g, Rfl: 2   doxycycline (VIBRA-TABS) 100 MG tablet, Take 1 tablet (100 mg total) by mouth 2 (two) times daily for 10 days., Disp: 20 tablet, Rfl: 0   predniSONE (DELTASONE) 20 MG tablet, Take 1 tablet (20 mg total) by mouth 2 (two) times daily with a meal for 5 days., Disp: 10 tablet, Rfl: 0   atorvastatin (LIPITOR) 40 MG tablet, Take 1 tablet (40 mg total) by mouth daily., Disp: 90 tablet, Rfl: 3   sildenafil (REVATIO) 20 MG tablet, Take 40 mg by mouth daily as needed for erectile dysfunction., Disp: , Rfl:   Observations/Objective: Today's Vitals   03/27/23 1507  Temp: (!) 102.6 F (39.2 C)  Weight: 170 lb (77.1 kg)  Height: 5\' 9"  (1.753 m)  PainSc: 8   PainLoc: Ankle   Physical Exam  Patient is alert and no acute distress noted.   Assessment and Plan: Bad odor of urine -     Urinalysis -     Urine Culture  Spider bite wound, accidental or unintentional, initial encounter Assessment & Plan: Advise patient to visit ED for worsening symptoms Started patient on Doxycyline 100 mg twice daily x 10 days Prednisone 20 mg x 5 days Albuterol PRN Advise Symptomatic treatment, rest, increase oral fluid intake. Take OTC tylenol for fever. Follow-up for worsening or persistent symptoms. Patient verbalizes understanding regarding plan of care and allquestions answered   Urinalysis  and urine culture ordered for foul smelling urine     Other orders -     Doxycycline Hyclate; Take 1 tablet (100 mg total) by mouth 2 (two) times daily for 10 days.  Dispense: 20 tablet; Refill: 0 -     Albuterol Sulfate HFA; Inhale 2 puffs into the lungs every 6 (six) hours as needed for wheezing or shortness of breath.  Dispense: 8 g; Refill: 2 -     predniSONE; Take 1 tablet (20 mg total) by mouth 2 (two) times daily with a meal for 5 days.  Dispense: 10 tablet; Refill: 0    Follow Up Instructions: No follow-ups on file.   I discussed  the assessment and treatment plan with the patient. The patient was provided an opportunity to ask questions, and all were answered. The patient agreed with the plan and demonstrated an understanding of the instructions.   The patient was advised to call back or seek an in-person evaluation if the symptoms worsen or if the condition fails to improve as anticipated.  The above assessment and management plan was discussed with the patient. The patient verbalized understanding of and has agreed to the management plan.   Cruzita Lederer Newman Nip, FNP

## 2023-03-27 NOTE — Assessment & Plan Note (Addendum)
Advise patient to visit ED for worsening symptoms Started patient on Doxycyline 100 mg twice daily x 10 days Prednisone 20 mg x 5 days Albuterol PRN Advise Symptomatic treatment, rest, increase oral fluid intake. Take OTC tylenol for fever. Follow-up for worsening or persistent symptoms. Patient verbalizes understanding regarding plan of care and allquestions answered   Urinalysis and urine culture ordered for foul smelling urine

## 2023-03-28 ENCOUNTER — Emergency Department (HOSPITAL_COMMUNITY): Payer: 59

## 2023-03-28 ENCOUNTER — Encounter (HOSPITAL_COMMUNITY): Payer: Self-pay | Admitting: *Deleted

## 2023-03-28 ENCOUNTER — Other Ambulatory Visit: Payer: Self-pay

## 2023-03-28 ENCOUNTER — Telehealth: Payer: 59 | Admitting: Family Medicine

## 2023-03-28 ENCOUNTER — Emergency Department (HOSPITAL_COMMUNITY)
Admission: EM | Admit: 2023-03-28 | Discharge: 2023-03-28 | Disposition: A | Discharge status: Home / Self Care | Payer: 59 | Attending: Emergency Medicine | Admitting: Emergency Medicine

## 2023-03-28 DIAGNOSIS — R739 Hyperglycemia, unspecified: Secondary | ICD-10-CM | POA: Diagnosis not present

## 2023-03-28 DIAGNOSIS — Z1152 Encounter for screening for COVID-19: Secondary | ICD-10-CM | POA: Diagnosis not present

## 2023-03-28 DIAGNOSIS — R112 Nausea with vomiting, unspecified: Secondary | ICD-10-CM | POA: Insufficient documentation

## 2023-03-28 DIAGNOSIS — R918 Other nonspecific abnormal finding of lung field: Secondary | ICD-10-CM | POA: Diagnosis not present

## 2023-03-28 DIAGNOSIS — J181 Lobar pneumonia, unspecified organism: Secondary | ICD-10-CM | POA: Insufficient documentation

## 2023-03-28 DIAGNOSIS — J189 Pneumonia, unspecified organism: Secondary | ICD-10-CM

## 2023-03-28 DIAGNOSIS — Z720 Tobacco use: Secondary | ICD-10-CM | POA: Diagnosis not present

## 2023-03-28 DIAGNOSIS — D72829 Elevated white blood cell count, unspecified: Secondary | ICD-10-CM | POA: Diagnosis not present

## 2023-03-28 DIAGNOSIS — J1282 Pneumonia due to coronavirus disease 2019: Secondary | ICD-10-CM | POA: Diagnosis not present

## 2023-03-28 DIAGNOSIS — U071 COVID-19: Secondary | ICD-10-CM | POA: Diagnosis not present

## 2023-03-28 DIAGNOSIS — R059 Cough, unspecified: Secondary | ICD-10-CM | POA: Diagnosis not present

## 2023-03-28 DIAGNOSIS — J984 Other disorders of lung: Secondary | ICD-10-CM | POA: Diagnosis not present

## 2023-03-28 DIAGNOSIS — R509 Fever, unspecified: Secondary | ICD-10-CM | POA: Diagnosis not present

## 2023-03-28 LAB — CBC WITH DIFFERENTIAL/PLATELET
Abs Immature Granulocytes: 0.08 10*3/uL — ABNORMAL HIGH (ref 0.00–0.07)
Basophils Absolute: 0 10*3/uL (ref 0.0–0.1)
Basophils Relative: 0 %
Eosinophils Absolute: 0 10*3/uL (ref 0.0–0.5)
Eosinophils Relative: 0 %
HCT: 44.1 % (ref 39.0–52.0)
Hemoglobin: 14.7 g/dL (ref 13.0–17.0)
Immature Granulocytes: 1 %
Lymphocytes Relative: 7 %
Lymphs Abs: 0.9 10*3/uL (ref 0.7–4.0)
MCH: 29 pg (ref 26.0–34.0)
MCHC: 33.3 g/dL (ref 30.0–36.0)
MCV: 87 fL (ref 80.0–100.0)
Monocytes Absolute: 1.1 10*3/uL — ABNORMAL HIGH (ref 0.1–1.0)
Monocytes Relative: 9 %
Neutro Abs: 10.8 10*3/uL — ABNORMAL HIGH (ref 1.7–7.7)
Neutrophils Relative %: 83 %
Platelets: 278 10*3/uL (ref 150–400)
RBC: 5.07 MIL/uL (ref 4.22–5.81)
RDW: 14.8 % (ref 11.5–15.5)
WBC: 13 10*3/uL — ABNORMAL HIGH (ref 4.0–10.5)
nRBC: 0 % (ref 0.0–0.2)

## 2023-03-28 LAB — COMPREHENSIVE METABOLIC PANEL
ALT: 29 U/L (ref 0–44)
AST: 29 U/L (ref 15–41)
Albumin: 3.1 g/dL — ABNORMAL LOW (ref 3.5–5.0)
Alkaline Phosphatase: 85 U/L (ref 38–126)
Anion gap: 12 (ref 5–15)
BUN: 24 mg/dL — ABNORMAL HIGH (ref 6–20)
CO2: 23 mmol/L (ref 22–32)
Calcium: 8.7 mg/dL — ABNORMAL LOW (ref 8.9–10.3)
Chloride: 99 mmol/L (ref 98–111)
Creatinine, Ser: 1.08 mg/dL (ref 0.61–1.24)
GFR, Estimated: >60 mL/min (ref >60)
Glucose, Bld: 145 mg/dL — ABNORMAL HIGH (ref 70–99)
Potassium: 3.9 mmol/L (ref 3.5–5.1)
Sodium: 134 mmol/L — ABNORMAL LOW (ref 135–145)
Total Bilirubin: 0.6 mg/dL (ref 0.3–1.2)
Total Protein: 7.6 g/dL (ref 6.5–8.1)

## 2023-03-28 LAB — SARS CORONAVIRUS 2 BY RT PCR: SARS Coronavirus 2 by RT PCR: NEGATIVE

## 2023-03-28 MED ORDER — ONDANSETRON HCL 4 MG PO TABS: 4.0000 mg | ORAL_TABLET | Freq: Three times a day (TID) | ORAL | 0 refills | Status: AC | PRN

## 2023-03-28 MED ORDER — ONDANSETRON HCL 4 MG/2ML IJ SOLN
4.0000 mg | Freq: Once | INTRAMUSCULAR | Status: AC
  Administered 2023-03-28: 4 mg via INTRAVENOUS
  Filled 2023-03-28: qty 2

## 2023-03-28 MED ORDER — SODIUM CHLORIDE 0.9 % IV BOLUS
1000.0000 mL | Freq: Once | INTRAVENOUS | Status: AC
  Administered 2023-03-28: 1000 mL via INTRAVENOUS

## 2023-03-28 NOTE — Discharge Instructions (Addendum)
You were seen in the emergency department for fatigue chills nausea vomiting.  Your chest x-ray showed a probable pneumonia in the left lower lobe.  Your COVID test was negative.  You were given IV fluids and nausea medication.  Please continue your antibiotics.  We are prescribing you some nausea medication.  Please stay well-hydrated.  Follow-up with your primary care doctor.  Return to the emergency department if any worsening or concerning symptoms

## 2023-03-28 NOTE — ED Triage Notes (Signed)
Pt c/o generalized body aches with fever and chills and night sweats and weakness that started x 5 days ago; pt states he has no appetite  Pt has been to urgent care and had a virtual visit yesterday; pt has been tested for covid and tested negative  Pt was prescribed amoxicillin and prednisone but is having episodes of vomiting

## 2023-03-28 NOTE — ED Provider Notes (Signed)
[place] EMERGENCY DEPARTMENT AT [facility] Provider Note   CSN: [id number] Arrival date & time: 03/28/23  1002     History  Chief Complaint  Patient presents with   Generalized Body Aches         Aaron Nash is a 57 y.o. male.  He is here with a complaint of fever up to 102 along with chills for the last 4 to 5 days.  He is feeling very fatigued.  Headache.  Body aches.  He was seen at urgent care and had a negative COVID swab.  Had a virtual visit and was started on doxycycline and prednisone.  He has had 2 doses.  Vomited up the medication today.  Has no appetite.  No diarrhea no urinary symptoms.  Did have a insect bite on his ankle but seems to be doing okay from that.  No rash.  The history is provided by the patient and a relative.  Influenza Presenting symptoms: fatigue, fever, headache, myalgias, nausea, rhinorrhea, sore throat and vomiting   Presenting symptoms: no cough, no diarrhea and no shortness of breath   Relieved by:  None tried Worsened by:  Nothing Ineffective treatments:  None tried Associated symptoms: chills   Risk factors: no immunocompromised state and no sick contacts        Home Medications Prior to Admission medications   Medication Sig Start Date End Date Taking? Authorizing Provider  albuterol (VENTOLIN HFA) 108 (90 Base) MCG/ACT inhaler Inhale 2 puffs into the lungs every 6 (six) hours as needed for wheezing or shortness of breath. 03/27/23   Del Newman Nip, Tenna Child, FNP  atorvastatin (LIPITOR) 40 MG tablet Take 1 tablet (40 mg total) by mouth daily. 12/17/22   Billie Lade, MD  doxycycline (VIBRA-TABS) 100 MG tablet Take 1 tablet (100 mg total) by mouth 2 (two) times daily for 10 days. 03/27/23 04/06/23  Del Nigel Berthold, FNP  predniSONE (DELTASONE) 20 MG tablet Take 1 tablet (20 mg total) by mouth 2 (two) times daily with a meal for 5 days. 03/27/23 04/01/23  Del Nigel Berthold, FNP  sildenafil (REVATIO) 20 MG  tablet Take 40 mg by mouth daily as needed for erectile dysfunction. 07/15/20   [provider]      Allergies    Patient has no known allergies.    Review of Systems   Review of Systems  Constitutional:  Positive for chills, fatigue and fever.  HENT:  Positive for rhinorrhea and sore throat.   Eyes:  Negative for visual disturbance.  Respiratory:  Negative for cough and shortness of breath.   Cardiovascular:  Negative for chest pain.  Gastrointestinal:  Positive for nausea and vomiting. Negative for abdominal pain and diarrhea.  Genitourinary:  Negative for dysuria.  Musculoskeletal:  Positive for myalgias.  Neurological:  Positive for headaches.    Physical Exam Updated Vital Signs BP 123/87 (BP Location: Right Arm)   Pulse 95   Temp 98.5 F (36.9 C) (Oral)   Resp 18   Ht 5\' 9"  (1.753 m)   Wt 77.1 kg   SpO2 99%   BMI 25.10 kg/m  Physical Exam Vitals and nursing note reviewed.  Constitutional:      General: He is not in acute distress.    Appearance: Normal appearance. He is well-developed.  HENT:     Head: Normocephalic and atraumatic.  Eyes:     Conjunctiva/sclera: Conjunctivae normal.  Cardiovascular:     Rate and Rhythm: Normal  rate and regular rhythm.     Heart sounds: No murmur heard. Pulmonary:     Effort: Pulmonary effort is normal. No respiratory distress.     Breath sounds: Normal breath sounds.  Abdominal:     Palpations: Abdomen is soft.     Tenderness: There is no abdominal tenderness. There is no guarding or rebound.  Musculoskeletal:        General: No deformity. Normal range of motion.     Cervical back: Neck supple.     Comments: He has a scab on his left lateral ankle.  No surrounding erythema.  Skin:    General: Skin is warm and dry.     Capillary Refill: Capillary refill takes less than 2 seconds.  Neurological:     General: No focal deficit present.     Mental Status: He is alert.     ED Results / Procedures / Treatments    Labs (all labs ordered are listed, but only abnormal results are displayed) Labs Reviewed  COMPREHENSIVE METABOLIC PANEL - Abnormal; Notable for the following components:      Result Value   Sodium 134 (*)    Glucose, Bld 145 (*)    BUN 24 (*)    Calcium 8.7 (*)    Albumin 3.1 (*)    All other components within normal limits  CBC WITH DIFFERENTIAL/PLATELET - Abnormal; Notable for the following components:   WBC 13.0 (*)    Neutro Abs 10.8 (*)    Monocytes Absolute 1.1 (*)    Abs Immature Granulocytes 0.08 (*)    All other components within normal limits  SARS CORONAVIRUS 2 BY RT PCR    EKG None  Radiology DG Chest 2 View  Result Date: 03/28/2023 CLINICAL DATA:  Cough, fever, and chills for 5 days. EXAM: CHEST - 2 VIEW COMPARISON:  None Available. FINDINGS: The heart size and mediastinal contours are within normal limits. Pulmonary airspace disease is seen in the posteromedial left lower lobe, suspicious for pneumonia. Right lung is clear. No evidence of pleural effusion. IMPRESSION: Left lower lobe airspace disease, suspicious for pneumonia. Recommend continued chest radiographic follow-up to confirm resolution. Electronically Signed   By: Danae Orleans M.D.   On: 03/28/2023 11:25    Procedures Procedures    Medications Ordered in ED Medications  sodium chloride 0.9 % bolus 1,000 mL (0 mLs Intravenous Stopped 03/28/23 1341)  ondansetron (ZOFRAN) injection 4 mg (4 mg Intravenous Given 03/28/23 1207)    ED Course/ Medical Decision Making/ A&P                                 Medical Decision Making Amount and/or Complexity of Data Reviewed Labs: ordered. Radiology: ordered.  Risk Prescription drug management.   This patient complains of fevers body aches malaise; this involves an extensive number of treatment Options and is a complaint that carries with it a high risk of complications and morbidity. The differential includes viral syndrome, COVID, flu, pneumonia  I  ordered, reviewed and interpreted labs, which included CBC with elevated white count normal hemoglobin, chemistries with mildly elevated glucose, COVID-negative I ordered medication IV fluids and nausea medication and reviewed PMP when indicated. I ordered imaging studies which included chest x-ray and I independently    visualized and interpreted imaging which showed probable left lower lobe infiltrate Additional history obtained from patient's mother Previous records obtained and reviewed in epic no recent admissions  Social determinants considered, ongoing tobacco use Critical Interventions: None  After the interventions stated above, I reevaluated the patient and found patient to be nontoxic-appearing and hemodynamically stable Admission and further testing considered, no indications for admission or further workup.  Patient already on doxycycline.  Will add nausea medication.  Recommended close follow-up with PCP.  Return instructions discussed         Final Clinical Impression(s) / ED Diagnoses Final diagnoses:  Community acquired pneumonia of left lower lobe of lung  Nausea and vomiting, unspecified vomiting type    Rx / DC Orders ED Discharge Orders          Ordered    ondansetron (ZOFRAN) 4 MG tablet  Every 8 hours PRN        03/28/23 1315              Terrilee Files, MD 03/28/23 1728

## 2023-04-09 ENCOUNTER — Telehealth: Payer: Self-pay

## 2023-04-09 NOTE — Telephone Encounter (Signed)
Transition Care Management Unsuccessful Follow-up Telephone Call  Date of discharge and from where:  Aaron Nash 8/22   Attempts:  1st Attempt  Reason for unsuccessful TCM follow-up call:  Missing or invalid number   [facility]  Aaron Nash, Aaron Nash Guide, Phone: [phone] Website: [name]

## 2023-04-09 NOTE — Telephone Encounter (Signed)
Transition Care Management Unsuccessful Follow-up Telephone Call  Date of discharge and from where:  A[facility] 8/22  Attempts:  2nd Attempt  Reason for unsuccessful TCM follow-up call:  Missing or invalid number   [facility]  [facility], [facility] Guide, Phone: [phone] Website: [name]

## 2023-05-16 ENCOUNTER — Ambulatory Visit: Payer: 59 | Admitting: Internal Medicine

## 2023-05-17 ENCOUNTER — Ambulatory Visit: Payer: 59 | Admitting: Internal Medicine

## 2023-05-24 ENCOUNTER — Ambulatory Visit: Payer: 59 | Admitting: Internal Medicine

## 2023-05-24 ENCOUNTER — Encounter: Payer: Self-pay | Admitting: Internal Medicine

## 2023-05-24 VITALS — BP 101/57 | HR 77 | Ht 69.0 in | Wt 180.4 lb

## 2023-05-24 DIAGNOSIS — E782 Mixed hyperlipidemia: Secondary | ICD-10-CM

## 2023-05-24 DIAGNOSIS — E538 Deficiency of other specified B group vitamins: Secondary | ICD-10-CM | POA: Diagnosis not present

## 2023-05-24 DIAGNOSIS — Z23 Encounter for immunization: Secondary | ICD-10-CM | POA: Diagnosis not present

## 2023-05-24 NOTE — Patient Instructions (Signed)
It was a pleasure to see you today.  Thank you for giving [place] the opportunity to be involved in your care.  Below is a brief recap of your visit and next steps.  We will plan to see you again in 6 months.  Summary No medication changes today Repeat labs ordered Flu shot today Follow up in 6 months

## 2023-05-24 NOTE — Progress Notes (Signed)
Established Patient Office Visit  Subjective   Patient ID: Aaron Nash, male    DOB: July 17, 1966  Age: 57 y.o. MRN: [id number]  Chief Complaint  Patient presents with   Medical Management of Chronic Issues    6 month   Mr. Aaron Nash returns to care today for routine follow-up.  He was last evaluated by me on 4/11 no medication changes were made at that time and 23-month follow-up was arranged.  In the interim, he was evaluated by gastroenterology and underwent attempted flexible sigmoidoscopy with Dr. Levon Hedger on 5/8.  Unfortunately colon preparation was inadequate.  ED presentation on 8/22 in the setting of generalized bodyaches and pneumonia.  There have otherwise been no acute interval events.  History Aaron Nash reports feeling well today.  He is asymptomatic and has no acute concerns to discuss.  Past Medical History:  Diagnosis Date   Atypical mole 01/08/2019   mild atypia on left upper back (free)   Atypical mole 01/08/2019   mild atypia on lower right back (free)   Basal cell carcinoma 12/12/2017   pigmented on right forehead (MOHs)   Cancer (HCC)    skin cancer, basal cell   Chronic diarrhea    Colon cancer (HCC) 2019   COVID-19 virus infection 03/2020   COVID-19 virus infection 08/2020   History of colonic polyps 06/2018   Patient states that there was numerous polyps and told one had a cancer seed in it.   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   Past Surgical History:  Procedure Laterality Date   COLONOSCOPY  06/2018   Dr.Cathey in [place]   COLONOSCOPY N/A 11/11/2019   Procedure: COLONOSCOPY;  Surgeon: Malissa Hippo, MD;  Location: [facility];  Service: Endoscopy;  Laterality: N/A;  145   FLEXIBLE SIGMOIDOSCOPY N/A 03/31/2020   Procedure: FLEXIBLE SIGMOIDOSCOPY;  Surgeon: Malissa Hippo, MD;   Location: [facility];  Service: Endoscopy;  Laterality: N/A;  200, per office pt knows new arrival time 6:30   FLEXIBLE SIGMOIDOSCOPY N/A 06/09/2020   Procedure: FLEXIBLE SIGMOIDOSCOPY;  Surgeon: Malissa Hippo, MD;  Location: [facility];  Service: Endoscopy;  Laterality: N/A;  730,moved to 11/4 @ 1:00, pt test +on 8/31, does not need covid test < 90 days   FLEXIBLE SIGMOIDOSCOPY  12/12/2022   Procedure: FLEXIBLE SIGMOIDOSCOPY;  Surgeon: Marguerita Merles, Reuel Boom, MD;  Location: [facility];  Service: Gastroenterology;;   FRACTURE SURGERY Left    leg - injury as a child   LOW ANTERIOR BOWEL RESECTION  06/27/2018   Rectal polyps - one with Tis cancer - [facility] - Dr Marcha Solders   POLYPECTOMY  11/11/2019   Procedure: POLYPECTOMY;  Surgeon: Malissa Hippo, MD;  Location: [facility];  Service: Endoscopy;;  Hepatic and ascending colon polyp biopsy forcep,  rectal polyp distal to anastamosis, large rectal polyp (piecemeal) hot snare.   POLYPECTOMY  06/09/2020   Procedure: POLYPECTOMY;  Surgeon: Malissa Hippo, MD;  Location: [facility];  Service: Endoscopy;;   XI ROBOT ASSISTED TRANSANAL RESECTION N/A 09/21/2020   Procedure: RECURRENT RECTAL POLYP PARTIAL PROCTECTOMY AND ROBOTIC TAMIS;  Surgeon: Karie Soda, MD;  Location: [facility];  Service: General;  Laterality: N/A;   Social History   Tobacco Use   Smoking status: Some Days    Current packs/day: 0.00  Types: Cigarettes    Last attempt to quit: 10/27/2014    Years since quitting: 8.5   Smokeless tobacco: Never  Vaping Use   Vaping status: Never Used  Substance Use Topics   Alcohol use: Yes    Comment: Per patient rarely   Drug use: Never   Family History  Problem Relation Age of Onset   Osteoporosis Mother    Hypertension Father    Healthy Sister    No Known Allergies  Review of Systems  Constitutional:  Negative for chills and fever.  HENT:  Negative for sore throat.   Respiratory:  Negative for cough and  shortness of breath.   Cardiovascular:  Negative for chest pain, palpitations and leg swelling.  Gastrointestinal:  Negative for abdominal pain, blood in stool, constipation, diarrhea, nausea and vomiting.  Genitourinary:  Negative for dysuria and hematuria.  Musculoskeletal:  Negative for myalgias.  Skin:  Negative for itching and rash.  Neurological:  Negative for dizziness and headaches.  Psychiatric/Behavioral:  Negative for depression and suicidal ideas.      Objective:     BP (!) 101/57   Pulse 77   Ht 5\' 9"  (1.753 m)   Wt 180 lb 6.4 oz (81.8 kg)   SpO2 97%   BMI 26.64 kg/m  BP Readings from Last 3 Encounters:  05/24/23 (!) 101/57  03/28/23 111/66  12/12/22 (!) 90/58   Physical Exam Vitals reviewed.  Constitutional:      General: He is not in acute distress.    Appearance: Normal appearance. He is not ill-appearing.  HENT:     Head: Normocephalic and atraumatic.     Right Ear: External ear normal.     Left Ear: External ear normal.     Nose: Nose normal. No congestion or rhinorrhea.     Mouth/Throat:     Mouth: Mucous membranes are moist.     Pharynx: Oropharynx is clear.  Eyes:     General: No scleral icterus.    Extraocular Movements: Extraocular movements intact.     Conjunctiva/sclera: Conjunctivae normal.     Pupils: Pupils are equal, round, and reactive to light.  Cardiovascular:     Rate and Rhythm: Normal rate and regular rhythm.     Pulses: Normal pulses.     Heart sounds: Normal heart sounds. No murmur heard. Pulmonary:     Effort: Pulmonary effort is normal.     Breath sounds: Normal breath sounds. No wheezing, rhonchi or rales.  Abdominal:     General: Abdomen is flat. Bowel sounds are normal. There is no distension.     Palpations: Abdomen is soft.     Tenderness: There is no abdominal tenderness.     Comments: Well-healed midline abdominal surgical incision  Musculoskeletal:        General: No swelling or deformity. Normal range of motion.      Cervical back: Normal range of motion.  Skin:    General: Skin is warm and dry.     Capillary Refill: Capillary refill takes less than 2 seconds.  Neurological:     General: No focal deficit present.     Mental Status: He is alert and oriented to person, place, and time.     Motor: No weakness.  Psychiatric:        Mood and Affect: Mood normal.        Behavior: Behavior normal.        Thought Content: Thought content normal.   Last CBC Lab Results  Component Value Date   WBC 13.0 (H) 03/28/2023   HGB 14.7 03/28/2023   HCT 44.1 03/28/2023   MCV 87.0 03/28/2023   MCH 29.0 03/28/2023   RDW 14.8 03/28/2023   PLT 278 03/28/2023   Last metabolic panel Lab Results  Component Value Date   GLUCOSE 145 (H) 03/28/2023   NA 134 (L) 03/28/2023   K 3.9 03/28/2023   CL 99 03/28/2023   CO2 23 03/28/2023   BUN 24 (H) 03/28/2023   CREATININE 1.08 03/28/2023   GFRNONAA >60 03/28/2023   CALCIUM 8.7 (L) 03/28/2023   PROT 7.6 03/28/2023   ALBUMIN 3.1 (L) 03/28/2023   LABGLOB 2.6 08/16/2022   AGRATIO 1.6 08/16/2022   BILITOT 0.6 03/28/2023   ALKPHOS 85 03/28/2023   AST 29 03/28/2023   ALT 29 03/28/2023   ANIONGAP 12 03/28/2023   Last lipids Lab Results  Component Value Date   CHOL 170 11/23/2022   HDL 34 (L) 11/23/2022   LDLCALC 116 (H) 11/23/2022   TRIG 108 11/23/2022   CHOLHDL 5.0 11/23/2022   Last hemoglobin A1c Lab Results  Component Value Date   HGBA1C 6.0 (H) 08/16/2022   Last thyroid functions Lab Results  Component Value Date   TSH 0.951 08/16/2022   Last vitamin D Lab Results  Component Value Date   VD25OH 27.6 (L) 08/16/2022   Last vitamin B12 and Folate Lab Results  Component Value Date   VITAMINB12 191 (L) 11/23/2022   FOLATE 7.8 11/23/2022   The 10-year ASCVD risk score (Arnett DK, et al., 2019) is: 9%    Assessment & Plan:   Problem List Items Addressed This Visit       Mixed hyperlipidemia    Lipid panel updated in April.  Total  cholesterol 170 and LDL 116.  Atorvastatin was increased to 40 mg daily as a result.  He endorses compliance with this regimen. -Repeat lipid panel ordered today      Vitamin B12 deficiency - Primary    Persistent vitamin B12 deficiency noted on labs from April.  He has started monthly B12 injections but reports recent inconsistency. -Repeat B12 level ordered today      Need for influenza vaccination    Influenza vaccine administered today      Return in about 6 months (around 11/22/2023).   Billie Lade, MD

## 2023-05-24 NOTE — Assessment & Plan Note (Signed)
Lipid panel updated in April.  Total cholesterol 170 and LDL 116.  Atorvastatin was increased to 40 mg daily as a result.  He endorses compliance with this regimen. -Repeat lipid panel ordered today

## 2023-05-24 NOTE — Assessment & Plan Note (Signed)
Persistent vitamin B12 deficiency noted on labs from April.  He has started monthly B12 injections but reports recent inconsistency. -Repeat B12 level ordered today

## 2023-05-24 NOTE — Assessment & Plan Note (Signed)
Influenza vaccine administered today.

## 2023-07-10 DIAGNOSIS — R112 Nausea with vomiting, unspecified: Secondary | ICD-10-CM | POA: Diagnosis not present

## 2023-07-10 DIAGNOSIS — N134 Hydroureter: Secondary | ICD-10-CM | POA: Diagnosis not present

## 2023-07-10 DIAGNOSIS — N132 Hydronephrosis with renal and ureteral calculous obstruction: Secondary | ICD-10-CM | POA: Diagnosis not present

## 2023-07-10 DIAGNOSIS — Z79899 Other long term (current) drug therapy: Secondary | ICD-10-CM | POA: Diagnosis not present

## 2023-07-10 DIAGNOSIS — I10 Essential (primary) hypertension: Secondary | ICD-10-CM | POA: Diagnosis not present

## 2023-07-10 DIAGNOSIS — R109 Unspecified abdominal pain: Secondary | ICD-10-CM | POA: Diagnosis not present

## 2023-07-10 DIAGNOSIS — R410 Disorientation, unspecified: Secondary | ICD-10-CM | POA: Diagnosis not present

## 2023-07-10 DIAGNOSIS — N202 Calculus of kidney with calculus of ureter: Secondary | ICD-10-CM | POA: Diagnosis not present

## 2023-11-22 ENCOUNTER — Encounter: Payer: Self-pay | Admitting: Internal Medicine

## 2023-11-22 ENCOUNTER — Ambulatory Visit: Payer: 59 | Admitting: Internal Medicine

## 2023-11-22 VITALS — BP 109/61 | HR 80 | Ht 69.0 in | Wt 180.2 lb

## 2023-11-22 DIAGNOSIS — R7303 Prediabetes: Secondary | ICD-10-CM | POA: Diagnosis not present

## 2023-11-22 DIAGNOSIS — E663 Overweight: Secondary | ICD-10-CM | POA: Diagnosis not present

## 2023-11-22 DIAGNOSIS — E538 Deficiency of other specified B group vitamins: Secondary | ICD-10-CM

## 2023-11-22 DIAGNOSIS — Z23 Encounter for immunization: Secondary | ICD-10-CM

## 2023-11-22 DIAGNOSIS — E782 Mixed hyperlipidemia: Secondary | ICD-10-CM | POA: Diagnosis not present

## 2023-11-22 DIAGNOSIS — Z85048 Personal history of other malignant neoplasm of rectum, rectosigmoid junction, and anus: Secondary | ICD-10-CM

## 2023-11-22 NOTE — Assessment & Plan Note (Signed)
 Aaron Nash has a history of rectal cancer s/p low anterior resection (2019) and recurrent tubulovillous adenoma of the rectum.  Most recently, he underwent attempted flexible sigmoidoscopy with Dr. Sammi Crick in May 2024.  Prep was poor and the procedure was aborted.  He is due for repeat colonoscopy.  Will assist with scheduling GI follow-up.

## 2023-11-22 NOTE — Assessment & Plan Note (Signed)
Shingles vaccine administered today

## 2023-11-22 NOTE — Patient Instructions (Signed)
 It was a pleasure to see you today.  Thank you for giving us  the opportunity to be involved in your care.  Below is a brief recap of your visit and next steps.  We will plan to see you again in 6 months.  Summary No medication changes today Repeat labs ordered I recommend scheduling follow up with GI for repeat colonoscopy Follow up in 6 months

## 2023-11-22 NOTE — Progress Notes (Signed)
 Established Patient Office Visit  Subjective   Patient ID: Aaron Nash, male    DOB: Dec 11, 1965  Age: 58 y.o. MRN: [id number]  Chief Complaint  Patient presents with   Care Management    Six month follow up   Aaron Nash returns to care Nash for routine follow-up.  He was last evaluated by me in October 2024.  No medication changes were made at that time, repeat labs ordered, and 46-month follow-up arranged.  In the interim, he presented to [facility] ER in early December endorsing right flank pain.  Workup revealed a 2 mm right ureteral stone, treated supportively.  There have otherwise been no acute interval events.  Aaron Nash feeling well Nash.  He is asymptomatic and has no acute concerns to discuss.  Past Medical History:  Diagnosis Date   Atypical mole 01/08/2019   mild atypia on left upper back (free)   Atypical mole 01/08/2019   mild atypia on lower right back (free)   Basal cell carcinoma 12/12/2017   pigmented on right forehead (MOHs)   Cancer (HCC)    skin cancer, basal cell   Chronic diarrhea    Colon cancer (HCC) 2019   COVID-19 virus infection 03/2020   COVID-19 virus infection 08/2020   History of colonic polyps 06/2018   Patient states that there was numerous polyps and told one had a cancer seed in it.   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   Past Surgical History:  Procedure Laterality Date   COLONOSCOPY  06/2018   Dr.Cathey in [place]   COLONOSCOPY N/A 11/11/2019   Procedure: COLONOSCOPY;  Surgeon: Golda Claudis PENNER, MD;  [facility]: [facility];  Service: Endoscopy;  Laterality: N/A;  145   FLEXIBLE SIGMOIDOSCOPY N/A 03/31/2020   Procedure: FLEXIBLE SIGMOIDOSCOPY;  Surgeon: Golda Claudis PENNER, MD;  Location: [facility];  Service: Endoscopy;  Laterality: N/A;  200, per office  pt knows new arrival time 6:30   FLEXIBLE SIGMOIDOSCOPY N/A 06/09/2020   Procedure: FLEXIBLE SIGMOIDOSCOPY;  Surgeon: Golda Claudis PENNER, MD;  Location: [facility];  Service: Endoscopy;  Laterality: N/A;  730,moved to 11/4 @ 1:00, pt test +on 8/31, does not need covid test < 90 days   FLEXIBLE SIGMOIDOSCOPY  12/12/2022   Procedure: FLEXIBLE SIGMOIDOSCOPY;  Surgeon: Eartha Flavors, Toribio, MD;  Location: [facility];  Service: Gastroenterology;;   FRACTURE SURGERY Left    leg - injury as a child   LOW ANTERIOR BOWEL RESECTION  06/27/2018   Rectal polyps - one with Tis cancer - [facility] - Dr Maranda   POLYPECTOMY  11/11/2019   Procedure: POLYPECTOMY;  Surgeon: Golda Claudis PENNER, MD;  Location: [facility];  Service: Endoscopy;;  Hepatic and ascending colon polyp biopsy forcep,  rectal polyp distal to anastamosis, large rectal polyp (piecemeal) hot snare.   POLYPECTOMY  06/09/2020   Procedure: POLYPECTOMY;  Surgeon: Golda Claudis PENNER, MD;  Location: [facility];  Service: Endoscopy;;   XI ROBOT ASSISTED TRANSANAL RESECTION N/A 09/21/2020   Procedure: RECURRENT RECTAL POLYP PARTIAL PROCTECTOMY AND ROBOTIC TAMIS;  Surgeon: Sheldon Standing, MD;  Location: [facility];  Service: General;  Laterality: N/A;   Social History   Tobacco Use   Smoking status: Some Days    Current packs/day: 0.00    Types: Cigarettes    Last  attempt to quit: 10/27/2014    Years since quitting: 9.0   Smokeless tobacco: Never  Vaping Use   Vaping status: Never Used  Substance Use Topics   Alcohol use: Yes    Comment: Per patient rarely   Drug use: Never   Family History  Problem Relation Age of Onset   Osteoporosis Mother    Hypertension Father    Healthy Sister    No Known Allergies  Review of Systems  Constitutional:  Negative for chills and fever.  HENT:  Negative for sore throat.   Respiratory:  Negative for cough and shortness of breath.   Cardiovascular:  Negative for chest pain, palpitations and leg  swelling.  Gastrointestinal:  Negative for abdominal pain, blood in stool, constipation, diarrhea, nausea and vomiting.  Genitourinary:  Negative for dysuria and hematuria.  Musculoskeletal:  Negative for myalgias.  Skin:  Negative for itching and rash.  Neurological:  Negative for dizziness and headaches.  Psychiatric/Behavioral:  Negative for depression and suicidal ideas.      Objective:     BP 109/61   Pulse 80   Ht 5' 9 (1.753 m)   Wt 180 lb 3.2 oz (81.7 kg)   SpO2 95%   BMI 26.61 kg/m  BP Readings from Last 3 Encounters:  11/22/23 109/61  05/24/23 (!) 101/57  03/28/23 111/66   Physical Exam Vitals reviewed.  Constitutional:      General: He is not in acute distress.    Appearance: Normal appearance. He is not ill-appearing.  HENT:     Head: Normocephalic and atraumatic.     Right Ear: External ear normal.     Left Ear: External ear normal.     Nose: Nose normal. No congestion or rhinorrhea.     Mouth/Throat:     Mouth: Mucous membranes are moist.     Pharynx: Oropharynx is clear.  Eyes:     General: No scleral icterus.    Extraocular Movements: Extraocular movements intact.     Conjunctiva/sclera: Conjunctivae normal.     Pupils: Pupils are equal, round, and reactive to light.  Cardiovascular:     Rate and Rhythm: Normal rate and regular rhythm.     Pulses: Normal pulses.     Heart sounds: Normal heart sounds. No murmur heard. Pulmonary:     Effort: Pulmonary effort is normal.     Breath sounds: Normal breath sounds. No wheezing, rhonchi or rales.  Abdominal:     General: Abdomen is flat. Bowel sounds are normal. There is no distension.     Palpations: Abdomen is soft.     Tenderness: There is no abdominal tenderness.  Musculoskeletal:        General: No swelling or deformity. Normal range of motion.     Cervical back: Normal range of motion.  Skin:    General: Skin is warm and dry.     Capillary Refill: Capillary refill takes less than 2 seconds.   Neurological:     General: No focal deficit present.     Mental Status: He is alert and oriented to person, place, and time.     Motor: No weakness.  Psychiatric:        Mood and Affect: Mood normal.        Behavior: Behavior normal.        Thought Content: Thought content normal.   Last CBC Lab Results  Component Value Date   WBC 13.0 (H) 03/28/2023   HGB 14.7 03/28/2023   HCT  44.1 03/28/2023   MCV 87.0 03/28/2023   MCH 29.0 03/28/2023   RDW 14.8 03/28/2023   PLT 278 03/28/2023   Last metabolic panel Lab Results  Component Value Date   GLUCOSE 145 (H) 03/28/2023   NA 134 (L) 03/28/2023   K 3.9 03/28/2023   CL 99 03/28/2023   CO2 23 03/28/2023   BUN 24 (H) 03/28/2023   CREATININE 1.08 03/28/2023   GFRNONAA >60 03/28/2023   CALCIUM  8.7 (L) 03/28/2023   PROT 7.6 03/28/2023   ALBUMIN 3.1 (L) 03/28/2023   LABGLOB 2.6 08/16/2022   AGRATIO 1.6 08/16/2022   BILITOT 0.6 03/28/2023   ALKPHOS 85 03/28/2023   AST 29 03/28/2023   ALT 29 03/28/2023   ANIONGAP 12 03/28/2023   Last lipids Lab Results  Component Value Date   CHOL 170 11/23/2022   HDL 34 (L) 11/23/2022   LDLCALC 116 (H) 11/23/2022   TRIG 108 11/23/2022   CHOLHDL 5.0 11/23/2022   Last hemoglobin A1c Lab Results  Component Value Date   HGBA1C 6.0 (H) 08/16/2022   Last thyroid functions Lab Results  Component Value Date   TSH 0.951 08/16/2022   Last vitamin D  Lab Results  Component Value Date   VD25OH 27.6 (L) 08/16/2022   Last vitamin B12 and Folate Lab Results  Component Value Date   VITAMINB12 191 (L) 11/23/2022   FOLATE 7.8 11/23/2022   The 10-year ASCVD risk score (Arnett DK, et al., 2019) is: 10.8%    Assessment & Plan:   Problem List Items Addressed This Visit       Mixed hyperlipidemia - Primary   Lipid panel last updated in April 2024.  Total cholesterol 170 and LDL 116.  Atorvastatin  was increased to 40 mg daily in light of this result.  Repeat lipid panel ordered Nash.       Vitamin B12 deficiency   Noted on previous labs.  He is currently on daily vitamin B12 supplementation.  Repeat B12 level ordered Nash.      Prediabetes   A1c 6.0 on labs from January 2024.  Repeat A1c ordered Nash.      History of rectal cancer   Aaron Nash has a history of rectal cancer s/p low anterior resection (2019) and recurrent tubulovillous adenoma of the rectum.  Most recently, he underwent attempted flexible sigmoidoscopy with Dr. Eartha in May 2024.  Prep was poor and the procedure was aborted.  He is due for repeat colonoscopy.  Will assist with scheduling GI follow-up.      Need for shingles vaccine   Shingles vaccine administered Nash      Return in about 6 months (around 05/23/2024).   Manus FORBES Fireman, MD

## 2023-11-22 NOTE — Assessment & Plan Note (Signed)
 Noted on previous labs.  He is currently on daily vitamin B12 supplementation.  Repeat B12 level ordered today.

## 2023-11-22 NOTE — Assessment & Plan Note (Signed)
 A1c 6.0 on labs from January 2024.  Repeat A1c ordered today.

## 2023-11-22 NOTE — Assessment & Plan Note (Signed)
 Lipid panel last updated in April 2024.  Total cholesterol 170 and LDL 116.  Atorvastatin  was increased to 40 mg daily in light of this result.  Repeat lipid panel ordered today.

## 2023-11-23 LAB — CMP14+EGFR
ALT: 11 IU/L (ref 0–44)
AST: 16 IU/L (ref 0–40)
Albumin: 4 g/dL (ref 3.8–4.9)
Alkaline Phosphatase: 98 IU/L (ref 44–121)
BUN/Creatinine Ratio: 14 (ref 9–20)
BUN: 15 mg/dL (ref 6–24)
Bilirubin Total: 0.6 mg/dL (ref 0.0–1.2)
CO2: 23 mmol/L (ref 20–29)
Calcium: 8.6 mg/dL — ABNORMAL LOW (ref 8.7–10.2)
Chloride: 105 mmol/L (ref 96–106)
Creatinine, Ser: 1.06 mg/dL (ref 0.76–1.27)
Globulin, Total: 2.4 g/dL (ref 1.5–4.5)
Glucose: 101 mg/dL — ABNORMAL HIGH (ref 70–99)
Potassium: 4 mmol/L (ref 3.5–5.2)
Sodium: 141 mmol/L (ref 134–144)
Total Protein: 6.4 g/dL (ref 6.0–8.5)
eGFR: 81 mL/min/{1.73_m2} (ref >59)

## 2023-11-23 LAB — CBC WITH DIFFERENTIAL/PLATELET
Basophils Absolute: 0.1 10*3/uL (ref 0.0–0.2)
Basos: 1 %
EOS (ABSOLUTE): 0.4 10*3/uL (ref 0.0–0.4)
Eos: 5 %
Hematocrit: 40.1 % (ref 37.5–51.0)
Hemoglobin: 13.6 g/dL (ref 13.0–17.7)
Immature Grans (Abs): 0 10*3/uL (ref 0.0–0.1)
Immature Granulocytes: 0 %
Lymphocytes Absolute: 2 10*3/uL (ref 0.7–3.1)
Lymphs: 26 %
MCH: 29.4 pg (ref 26.6–33.0)
MCHC: 33.9 g/dL (ref 31.5–35.7)
MCV: 87 fL (ref 79–97)
Monocytes Absolute: 0.6 10*3/uL (ref 0.1–0.9)
Monocytes: 8 %
Neutrophils Absolute: 4.6 10*3/uL (ref 1.4–7.0)
Neutrophils: 60 %
Platelets: 298 10*3/uL (ref 150–450)
RBC: 4.63 x10E6/uL (ref 4.14–5.80)
RDW: 13.3 % (ref 11.6–15.4)
WBC: 7.6 10*3/uL (ref 3.4–10.8)

## 2023-11-23 LAB — B12 AND FOLATE PANEL
Folate: 6.1 ng/mL (ref >3.0)
Vitamin B-12: 218 pg/mL — ABNORMAL LOW (ref 232–1245)

## 2023-11-23 LAB — TSH+FREE T4
Free T4: 1.32 ng/dL (ref 0.82–1.77)
TSH: 0.809 u[IU]/mL (ref 0.450–4.500)

## 2023-11-23 LAB — LIPID PANEL
Chol/HDL Ratio: 3.8 ratio (ref 0.0–5.0)
Cholesterol, Total: 153 mg/dL (ref 100–199)
HDL: 40 mg/dL (ref >39)
LDL Chol Calc (NIH): 99 mg/dL (ref 0–99)
Triglycerides: 74 mg/dL (ref 0–149)
VLDL Cholesterol Cal: 14 mg/dL (ref 5–40)

## 2023-11-23 LAB — VITAMIN D 25 HYDROXY (VIT D DEFICIENCY, FRACTURES): Vit D, 25-Hydroxy: 33.5 ng/mL (ref 30.0–100.0)

## 2023-11-23 LAB — HEMOGLOBIN A1C
Est. average glucose Bld gHb Est-mCnc: 120 mg/dL
Hgb A1c MFr Bld: 5.8 % — ABNORMAL HIGH (ref 4.8–5.6)

## 2023-11-25 ENCOUNTER — Encounter: Payer: Self-pay | Admitting: Internal Medicine

## 2023-11-27 ENCOUNTER — Ambulatory Visit (INDEPENDENT_AMBULATORY_CARE_PROVIDER_SITE_OTHER)

## 2023-11-27 DIAGNOSIS — E538 Deficiency of other specified B group vitamins: Secondary | ICD-10-CM

## 2023-11-27 MED ORDER — CYANOCOBALAMIN 1000 MCG/ML IJ SOLN
1000.0000 ug | Freq: Once | INTRAMUSCULAR | Status: AC
  Administered 2023-11-27: 1000 ug via INTRAMUSCULAR

## 2023-11-27 NOTE — Progress Notes (Signed)
 Patient is in office today for a nurse visit for B12 Injection. Patient Injection was given in the  Left deltoid. Patient tolerated injection well.

## 2024-02-04 ENCOUNTER — Other Ambulatory Visit: Payer: Self-pay | Admitting: Internal Medicine

## 2024-02-04 DIAGNOSIS — E782 Mixed hyperlipidemia: Secondary | ICD-10-CM

## 2024-05-20 ENCOUNTER — Encounter (INDEPENDENT_AMBULATORY_CARE_PROVIDER_SITE_OTHER): Payer: Self-pay | Admitting: Gastroenterology

## 2024-08-05 ENCOUNTER — Ambulatory Visit: Payer: Self-pay

## 2024-08-05 NOTE — Telephone Encounter (Addendum)
 Per workflow, routing to clinic for follow up to no contact X 3 attempts.   3rd attempt to reach patient is unsuccessful, left voicemail to return call for nurse triage. No pcp on file, patient needs TOC from [facility] who retired.   2nd attempt to reach patient, left voicemail to return call for nurse triage.

## 2024-08-05 NOTE — Telephone Encounter (Signed)
 Patients sister Dorthea (consent not on file advised not able to provide patient information able to take information ) 102 fever last night. Sounds congested. Has been hearing coughing that's all I know    Myles Dorthea if patient having difficulty breathing or chest pain needs to go ER . Myles Dorthea likely patient needs to be seen today advised UC for assessment or ER if difficulty breathing or chest pain. Cindy agreeable. Myles Dorthea will call patient and try to reach now.   Call placed to patient , unable to reach left message requesting call back      Reason for Disposition  [1] Caller is not with the adult (patient) AND [2] probable NON-URGENT symptoms  Answer Assessment - Initial Assessment Questions Patients sister Dorthea (consent not on file advised not able to provide patient information able to take information ) 102 fever last night. Sounds congested. Has been hearing coughing that's all I know    Myles Dorthea if patient having difficulty breathing or chest pain needs to go ER . Myles Dorthea likely patient needs to be seen today advised UC for assessment or ER if difficulty breathing or chest pain. Cindy agreeable. Myles Dorthea will call patient and try to reach now.   Call placed to patient , unable to reach left message requesting call back     1. REASON FOR CALL: What is the main reason for your call? or How can I best help you?     Sister called no consent reporting patient with fever 102 last night and cough  2. SYMPTOMS : Do you have any symptoms?      Fever 102 cough and congested  3. OTHER QUESTIONS: Do you have any other questions?     None at this time sister is going to keep trying to reach patient and recommend UC or ER depending on symptoms  Protocols used: Information Only Call - No Triage-A-AH  Copied from CRM [id number]. Topic: Clinical - Red Word Triage >> Aug 05, 2024 11:38 AM Victoria B wrote: [facility] that prompted transfer to Nurse  Triage: patient's sister called in , patient has fever of 102

## 2024-08-05 NOTE — Telephone Encounter (Signed)
 This encounter was created in error - please disregard.

## 2024-08-05 NOTE — Telephone Encounter (Signed)
 Fever100.2,  body aches fatigue runny nose, ha, cough gtts, vit,mucinex, cold/ flu Reason for Disposition  Cough  Answer Assessment - Initial Assessment Questions 1. ONSET: When did the cough begin?      2 days 2. SEVERITY: How bad is the cough today?      Moderate 3. SPUTUM: Describe the color of your sputum (e.g., none, dry cough; clear, white, yellow, green)     clear 4. HEMOPTYSIS: Are you coughing up any blood? If Yes, ask: How much? (e.g., flecks, streaks, tablespoons, etc.)     denies 5. DIFFICULTY BREATHING: Are you having difficulty breathing? If Yes, ask: How bad is it? (e.g., mild, moderate, severe)      denies 6. FEVER: Do you have a fever? If Yes, ask: What is your temperature, how was it measured, and when did it start?     100.2 7. CARDIAC HISTORY: Do you have any history of heart disease? (e.g., heart attack, congestive heart failure)      no 8. LUNG HISTORY: Do you have any history of lung disease?  (e.g., pulmonary embolus, asthma, emphysema)     no 9. PE RISK FACTORS: Do you have a history of blood clots? (or: recent major surgery, recent prolonged travel, bedridden)     no 10. OTHER SYMPTOMS: Do you have any other symptoms? (e.g., runny nose, wheezing, chest pain)       Denies cp, runny nose, body aches  Protocols used: Cough - Acute Productive-A-AH

## 2024-08-07 ENCOUNTER — Ambulatory Visit: Payer: Self-pay

## 2024-08-07 NOTE — Telephone Encounter (Signed)
 Noted ED or urgent care advised

## 2024-08-07 NOTE — Telephone Encounter (Signed)
" °  FYI Only or Action Required?: FYI only for provider: recommended to Urgent Care.  Patient was last seen in primary care on 11/22/2023 by Melvenia Manus BRAVO, MD.  Called Nurse Triage reporting Cough.  Symptoms began 4 days ago.  Interventions attempted: Rest, hydration, or home remedies.  Symptoms are: unchanged.  Triage Disposition: See Physician Within 24 Hours  Patient/caregiver understands and will follow disposition?: Yes Copied from CRM #[id number]. Topic: Clinical - Red Word Triage >> Aug 07, 2024  2:37 PM Mercer PEDLAR wrote: Red Word that prompted transfer to Nurse Triage: Fever, body aches, productive cough, headache, not feeling well. Reason for Disposition  SEVERE coughing spells (e.g., whooping sound after coughing, vomiting after coughing)  Answer Assessment - Initial Assessment Questions Patient reports cough, fever, body aches and headache for four days. Patient recommended to be seen at Urgent Care  1. ONSET: When did the cough begin?      Patient reports cough started on Tuesday 2. SEVERITY: How bad is the cough today?      Severe at times 3. SPUTUM: Describe the color of your sputum (e.g., none, dry cough; clear, white, yellow, green)     green 4. HEMOPTYSIS: Are you coughing up any blood? If Yes, ask: How much? (e.g., flecks, streaks, tablespoons, etc.)     no 5. DIFFICULTY BREATHING: Are you having difficulty breathing? If Yes, ask: How bad is it? (e.g., mild, moderate, severe)      At times 6. FEVER: Do you have a fever? If Yes, ask: What is your temperature, how was it measured, and when did it start?     Yes-100.2 7. CARDIAC HISTORY: Do you have any history of heart disease? (e.g., heart attack, congestive heart failure)      no 8. LUNG HISTORY: Do you have any history of lung disease?  (e.g., pulmonary embolus, asthma, emphysema)     no 9. PE RISK FACTORS: Do you have a history of blood clots? (or: recent major surgery, recent prolonged  travel, bedridden)     no 10. OTHER SYMPTOMS: Do you have any other symptoms? (e.g., runny nose, wheezing, chest pain)       headache 12. TRAVEL: Have you traveled out of the country in the last month? (e.g., travel history, exposures)       no  Protocols used: Cough - Acute Productive-A-AH  "
